# Patient Record
Sex: Female | Born: 1980
Health system: Southern US, Community
[De-identification: ages and names within clinical notes are randomized; demographics above are authoritative.]

## PROBLEM LIST (undated history)

## (undated) DIAGNOSIS — Z8719 Personal history of other diseases of the digestive system: Secondary | ICD-10-CM

## (undated) DIAGNOSIS — Z8742 Personal history of other diseases of the female genital tract: Secondary | ICD-10-CM

## (undated) DIAGNOSIS — N390 Urinary tract infection, site not specified: Secondary | ICD-10-CM

## (undated) DIAGNOSIS — B019 Varicella without complication: Secondary | ICD-10-CM

## (undated) DIAGNOSIS — N971 Female infertility of tubal origin: Secondary | ICD-10-CM

## (undated) DIAGNOSIS — K219 Gastro-esophageal reflux disease without esophagitis: Secondary | ICD-10-CM

## (undated) HISTORY — DX: Varicella without complication: B01.9

## (undated) HISTORY — DX: Urinary tract infection, site not specified: N39.0

## (undated) HISTORY — DX: Gastro-esophageal reflux disease without esophagitis: K21.9

## (undated) HISTORY — DX: Female infertility of tubal origin: N97.1

## (undated) HISTORY — DX: Personal history of other diseases of the female genital tract: Z87.42

## (undated) HISTORY — DX: Personal history of other diseases of the digestive system: Z87.19

---
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
## 2002-06-14 ENCOUNTER — Emergency Department (HOSPITAL_COMMUNITY): Admission: EM | Admit: 2002-06-14 | Discharge: 2002-06-14 | Payer: Self-pay

## 2002-07-16 ENCOUNTER — Ambulatory Visit (HOSPITAL_COMMUNITY): Admission: RE | Admit: 2002-07-16 | Discharge: 2002-07-16 | Payer: Self-pay

## 2003-02-05 ENCOUNTER — Observation Stay (HOSPITAL_COMMUNITY): Admission: AD | Admit: 2003-02-05 | Discharge: 2003-02-06 | Payer: Self-pay | Admitting: Family Medicine

## 2003-04-09 ENCOUNTER — Other Ambulatory Visit: Admission: RE | Admit: 2003-04-09 | Discharge: 2003-04-09 | Payer: Self-pay | Admitting: Obstetrics and Gynecology

## 2003-06-04 ENCOUNTER — Inpatient Hospital Stay (HOSPITAL_COMMUNITY): Admission: EM | Admit: 2003-06-04 | Discharge: 2003-06-06 | Payer: Self-pay | Admitting: Emergency Medicine

## 2003-08-13 ENCOUNTER — Inpatient Hospital Stay (HOSPITAL_COMMUNITY): Admission: EM | Admit: 2003-08-13 | Discharge: 2003-08-15 | Payer: Self-pay | Admitting: Emergency Medicine

## 2004-06-20 ENCOUNTER — Other Ambulatory Visit: Admission: RE | Admit: 2004-06-20 | Discharge: 2004-06-20 | Payer: Self-pay | Admitting: Obstetrics and Gynecology

## 2004-07-14 ENCOUNTER — Emergency Department (HOSPITAL_COMMUNITY): Admission: EM | Admit: 2004-07-14 | Discharge: 2004-07-15 | Payer: Self-pay | Admitting: Emergency Medicine

## 2005-07-20 ENCOUNTER — Other Ambulatory Visit: Admission: RE | Admit: 2005-07-20 | Discharge: 2005-07-20 | Payer: Self-pay | Admitting: Obstetrics and Gynecology

## 2007-02-03 ENCOUNTER — Other Ambulatory Visit: Admission: RE | Admit: 2007-02-03 | Discharge: 2007-02-03 | Payer: Self-pay | Admitting: Obstetrics and Gynecology

## 2007-02-04 ENCOUNTER — Emergency Department (HOSPITAL_COMMUNITY): Admission: EM | Admit: 2007-02-04 | Discharge: 2007-02-04 | Payer: Self-pay | Admitting: Emergency Medicine

## 2007-05-22 ENCOUNTER — Emergency Department (HOSPITAL_COMMUNITY): Admission: EM | Admit: 2007-05-22 | Discharge: 2007-05-23 | Payer: Self-pay | Admitting: Emergency Medicine

## 2007-05-30 ENCOUNTER — Emergency Department (HOSPITAL_COMMUNITY): Admission: EM | Admit: 2007-05-30 | Discharge: 2007-05-30 | Payer: Self-pay | Admitting: Family Medicine

## 2008-06-26 LAB — HM PAP SMEAR: HM Pap smear: NORMAL

## 2008-07-05 ENCOUNTER — Ambulatory Visit (HOSPITAL_COMMUNITY): Admission: RE | Admit: 2008-07-05 | Discharge: 2008-07-05 | Payer: Self-pay | Admitting: Obstetrics and Gynecology

## 2010-11-03 NOTE — Consult Note (Signed)
NAME:  Julie Daniel, Julie Daniel                         ACCOUNT NO.:  [id number]   MEDICAL RECORD NO.:  [id number]                   PATIENT TYPE:  INP   LOCATION:  5705                                 FACILITY:  [facility]   PHYSICIAN:  Charles A. Sydnee Cabal, MD            DATE OF BIRTH:  1980/09/24   DATE OF CONSULTATION:  06/04/2003  DATE OF DISCHARGE:                                   CONSULTATION   REASON FOR CONSULTATION:  Abdominal-pelvic pain.   HISTORY OF PRESENT ILLNESS:  She is a 30 year old para 0, 0, 0, 0, last  menstrual period about three weeks ago who presented complaining of a one-  week history of increasing abdominal-pelvic pain.  She was seen in my office  10 days ago and felt to have a ruptured ovarian cyst, but was recuperating  well on pain medication, from the ultrasound that was done about four days  prior.  This was felt to be a left ovarian cyst.  She had left-sided pain  initially and then this shifted over to the right.  With the ultrasound she  had some right pelvic pain although ultrasound showed a cyst on the left  ovary.  A small cyst, minimally enlarged ovary of 5 cm with multiple  follicles.  There was free fluid in the pelvis.  She denies history of PID.  She has some irregularity in her periods, is on no contraception, and states  she has not gotten pregnant with intercourse for the last five years.  She  is frustrated in this regard.  Specifically for this hospitalization she  stated she had nausea and vomiting and no bowel movement for 10 days.  She  used Mag-Citrate two days ago and did get liquid expelled.  She is hesitant  to state she has flatus but states there is no bowel movement for 10 days.  Usual bowel movement picture would be once every three to four days.  She  complains of pain in the right subcostal region, right flank region and down  into the right lower quadrant.  She states this pain has been constant with  exacerbations that are sharp and  shooting and stabbing up into the ribs.  Nausea and vomiting has been present but then she volunteers further  information that nausea and vomiting is a daily expectation and experience  with her for the last several years.  She does feel she keeps some things  down, she does not have projectile vomitus or vomiting all material such as  liquids.  She was able to take a laxative two days ago without difficulty.  She last ate two days ago.   PAST MEDICAL HISTORY:  Chlamydia, treated within the last year.   SURGICAL HISTORY:  None.   MEDICATIONS:  Hydrocodone since diagnosis of ruptured ovarian cyst  approximately 10 days ago.   ALLERGIES:  No known drug allergies.   SOCIAL HISTORY:  Smokes half-pack per  day of cigarettes.  No alcohol or drug  use. SCD exposure with HPV and chlamydia in the past.  She states she is  married and has a monogamous relationship with her husband.   FAMILY HISTORY:  Denies family history of breast, cervical, colon cancer,  lymphoma, coronary artery disease, stroke, diabetes, or hypertension.   REVIEW OF SYSTEMS:  Denies fever or chills, skin lesions or rashes,  headaches, dizziness, seasonal allergies, chest pain, shortness of breath,  wheezing.  She denies diarrhea.  She does have constipation, no bleeding per  rectum, melena or hematochezia.  She has infrequent urination, no dysuria,  urgency or frequency.  She is irritated regarding ongoing problems without  specific answer as far as to ovarian cyst and infertility, and frustrated  that she cannot go smoke during this hospitalization, feeling that smoking  would help her with a bowel movement.   VITAL SIGNS:  See vitals as noted in the chart and on the chart vitals have  been stable  Not available to me at this dictation other than vitals from last night.  Temperature 98.1, respirations 20, pulse 68, blood pressure 107/54.   LABORATORY DATA:  Hematocrit and hemoglobin 13.3, 39.  BMET 138, sodium  3.6,  potassium 107, chloride 28, BUN 3, creatinine 0.5, glucose 86, calcium 8.0.   PHYSICAL EXAMINATION:  GENERAL:  Alert and oriented x3, no distress at this  time.  HEENT:  Normal grossly.  CARDIOVASCULAR:  Regular rate and rhythm.  LUNGS:  Clear bilaterally.  BREASTS:  Exam deferred.  ABDOMEN:  Upper quadrant soft and nontender.  Right subcostal region mildly  tender with examination to deep palpation, costal edge not enlarged, spleen  not palpable.  Left lower quadrant nontender and soft, overall lower  quadrants bilaterally soft with no guarding or rebound or peritoneal signs  present.  In the mid epigastrium there is some mild fullness with  possibility of stool in the colon, this is mobile and not firm.  PELVIC EXAM:  Bimanual was undertaken without discharge or bleeding.  There  is no cervical motion tenderness present.  Uterus is not enlarged, mid  plane.  Left ovary palpable within normal size grossly and nontender. Right  ovary specifically palpated within normal size and nontender.  Uterus was  nontender.  Examination did not reproduce abdominal-pelvic pain findings.  Additionally, bowel sounds are present and I did not hear any tympanic bowel  sounds or rushes.  Abdomen was non-distended.   Cat scan showed a possible partial small bowel obstruction process.  Transvaginal ultrasound showed a left ovarian cyst with some peritoneal free  fluid, this is consistent with examination at my office in the past two  weeks as far as the ovary and cyst and free fluid.   ASSESSMENT:  1. Abdominal pain, unclear etiology.  I did not find clinical findings     consistent with bowel obstruction at this time.  I did not find an acute     abdomen process at this time.  I do not feel that gynecologically there     is any process directly contributing to the etiology of her abdominal     discomfort at this time.  Exam is improved overall based on my    examination even at my office about  10 days ago.   PLAN:  This patient will likely need to have laparoscopy done as an  outpatient.  If further nausea and vomiting and pain were the main issues,  perhaps general surgery  consultation would be in order, if she continues  with nondescript pain could proceed with laparoscopy for evaluation from a  gynecological stand-point although I do not feel gynecologically her pain  during this admission is centered in the pelvis specifically, related to a  gynecological problem.  I do feel a pregnancy test should be done once  again.  Consideration could be given for pain up in the subcostal region to  evaluate the gallbladder, such as gallbladder evaluation with an ultrasound  would be in order.  If this is not done during this admission I would  recommend this be arranged on an outpatient basis.  I will go ahead and give  her a prescription for pain, Darvocet N-100 for the patient to use.  She  does have some Hydrocodone at home to use if the Darvocet does not cover the  pain and will see the patient in follow up at the office.  Should Dr.  Tresa Endo agree and discharge the patient, the patient is in agreement.                                               Charles A. Sydnee Cabal, MD    CAD/MEDQ  D:  06/04/2003  T:  06/05/2003  Job:  [id number]

## 2010-11-03 NOTE — H&P (Signed)
NAMEJAZARA, Julie Daniel                         ACCOUNT NO.:  [id number]   MEDICAL RECORD NO.:  [id number]                   PATIENT TYPE:  INP   LOCATION:  5705                                 FACILITY:  [facility]   PHYSICIAN:  Jackie Plum, M.D.             DATE OF BIRTH:  [birth year]   DATE OF ADMISSION:  06/04/2003  DATE OF DISCHARGE:                                HISTORY & PHYSICAL   PROBLEM LIST:  1. Partial small-bowel obstruction secondary to pelvic process.  2. History of ovarian cyst.   CHIEF COMPLAINT:  Abdominal pain x1 week.   HISTORY OF PRESENT ILLNESS:  The patient is a 30 year old Caucasian lady who  presents with a one-week history of abdominal pain which she describes as  being constant and has been increasing in intensity.  The site has been  described as more of generalized and diffuse abdominal pain especially  involving the right lumbar and right lower quadrant area.  She has been  having some vomiting every day, in the morning, and has been constipated  without any bowel movement for one week.  The severity of the pain was  described as moderate in intensity, occasionally aggravated by movements and  deep breaths, without any known relieving factor.  She denies any frequency  of micturition or dysuria, fever or chills, headaches, sore throat,  discharge in his cough, sputum production, shortness of breath or chest  pain.  She admits to back pain, especially involving the right flank area.  Her last menstrual period was about three weeks ago.  The patient was  subsequently seen by Dr. Sydnee Cabal of [facility] for evaluation of  suspected ovarian cyst rupture.  On account of progressively worsening  abdominal pain, the patient was sent from her primary care physician, Dr.  Aurelio Brash office, for evaluation at the ED.  ED exam was notable for  moderate tenderness involving the epigastric area with increased bowel  sounds.  Pelvic exam was done by the EDP,  which is notable for right adnexal  tenderness and suspected right adnexal mass.  Initial acute abdominal series  was done which was negative for any acute process.  This was followed up  with a CT of the abdomen and pelvis which revealed a partial small-bowel  obstruction with 2 cm cyst involving the left adnexa and complex fluid in  the pelvis.  Followup ultrasound revealed a left ovarian cyst with moderate  free fluid in the pelvis.  She was given an IV of normal saline at 150 mL  per hour with 4 mg of IV morphine and subsequently, Dilaudid 1 mg IV  followed with GI cocktail and we were called to evaluate for admission.   PAST MEDICAL HISTORY:  Please see problem list above.   MEDICATION HISTORY:  The patient is not on any regular medications.   ALLERGY HISTORY:  There are no known drug allergies.   FAMILY HISTORY:  Family  history is negative for heart disease or diabetes  mellitus or hypertension.   SOCIAL HISTORY:  She works in [employer].  She lives with her boyfriend  and does not have any kids.  She does not smoke cigarettes nor drink  alcohol.   REVIEW OF SYSTEMS:  Significant positive and negative are as in the HPI  above, otherwise, unremarkable.   PHYSICAL EXAMINATION:  VITAL SIGNS:  Blood pressure was 121/66, temperature  98.6, pulse 66, respiratory rate 16, saturations 100% on room air.  GENERAL:  She was in moderate painful distress.  HEENT:  Normocephalic, atraumatic.  Pupils were equal, round and reactive to  light.  Extraocular movements were intact.  Oropharynx was slightly dry  without any oropharyngeal exudate or erythema.  NECK:  Neck was supple with no thyromegaly.  LUNGS:  Lungs were clear to auscultation bilaterally.  ABDOMEN:  Exam was notable for generalized guarding, especially in the  region of the right upper quadrant, right lumbar area, as well as right  lower quadrant region.  There was some significant guarding without any  rebound  tenderness.  Bowel sounds were increased.  There was no  hepatosplenomegaly.  PELVIC:  Exam was not repeated.  RECTAL:  Exam notable for heme-negative stools, according to the EDP.  SKIN:  Skin was warm and dry.  EXTREMITIES:  No pedal edema, no clubbing.  NEUROLOGIC:  Alert and oriented x3.  No focal deficits.   LABORATORIES:  Abdominal x-ray, abdominal and pelvic CT and pelvic  ultrasound as mentioned under HPI above.   WBC count 7.6, hemoglobin 13.3, hematocrit 39.0, MCV 95.1, platelet count  308,000.  Sodium 139, potassium 3.5, chloride 106, glucose 91, BUN 4,  creatinine 0.6, total bilirubin less than 0.1, alkaline phosphatase 63,  OT  17, PT 12, total protein 6.4, albumin 3.0, lipase 24.  Urine pregnancy test  was negative.  UA was negative for any urinary tract infection.  GC/yeast --  none seen, Trichomonas -- none seen, a few clue cells, no wbc's on wet prep.   ASSESSMENT:  Partial small-bowel obstruction secondary to pelvic process.  Differential for pelvic process includes pelvic abscess such as pelvic  hematoma from possible ruptured ovarian cyst.  The patient relates that she  had seen her gynecologist recently who had thought she had a pelvic cyst  rupture.   PLAN:  The plan of care is to admit the patient to a non-telemetry bed,  start her on supportive care including IV fluid supplementation and  analgesics.  She will be admitted per [place] for now.  Her obstruction will be  monitored and we will try to contact her gynecologist for further evaluation  regarding her pelvic fluid.  The question is whether she will need  culdocentesis to further evaluate this so-called pelvic fluid which may be  etiologic of whatever the cause is in her partial small-bowel obstruction.                                                Jackie Plum, M.D.    GO/MEDQ  D:  06/04/2003  T:  06/04/2003  Job:  [id number]   cc:   Vikki Ports, M.D.  [address]   [place] [address]  Fax: [phone]   Charles A. Sydnee Cabal, MD  Fax: [phone]

## 2010-11-03 NOTE — Discharge Summary (Signed)
NAME:  Julie Daniel, Julie Daniel                         ACCOUNT NO.:  [id number]   MEDICAL RECORD NO.:  [id number]                   PATIENT TYPE:  INP   LOCATION:  5705                                 FACILITY:  [facility]   PHYSICIAN:  Sherin Quarry, MD                   DATE OF BIRTH:  08/14/1980   DATE OF ADMISSION:  06/03/2003  DATE OF DISCHARGE:  06/06/2003                                 DISCHARGE SUMMARY   Julie Daniel is a 30 year old lady who initially presented to the [facility] Emergency Room on June 04, 2003, with a history of abdominal pain  which she described as being constant and increasing in intensity.  If the  exact site of this discomfort was somewhat uncertain, she initially  described it as being in the right lower quadrant area.  Subsequently she  indicated it was more in the right lumbar area and then later she indicated  it was more in the right upper quadrant area.  The patient reported that she  had been experiencing vomiting on a daily basis every morning for several  years.  She described the pain as being of moderate to severe intensity.  She said that there was no associated dysuria, fevers or chills.  She had no  cough.  There was no hematemesis or melena.  Her last menstrual period was  three weeks ago.  In the emergency room, prior to our evaluation, the  patient underwent a CT scan of he abdomen.  This showed complex fluid within  the pelvis.  The right ovary was noted to be enlarged with multiple cystic  areas.  The left ovary was similarly sized.  There was a 3.4 cm cyst in the  left adnexa, a moderate amount of fluid was noted in the pelvis.  It was  suggested that this might be a hematoma secondary to a ruptured ovarian  cyst.  No other abnormalities were seen on the CT scan of the abdomen.  Pregnancy test was negative.  Liver profile was within normal limits.  Urinalysis was normal.  White count was 7600, hemoglobin 11.7, differential  was normal.   Liver profile was notable for albumin of 3.0.  Subsequent to  admission, the patient was once again seen by Dr. Sydnee Cabal.  He repeated a  pelvic examination which he described as benign.  He suggested that a  gallbladder ultrasound be obtained.  The verbal report on the gallbladder  ultrasound was that it was normal.  Dr. Sydnee Cabal recommended that the  patient return to his office in one to two weeks and that he would proceed  with an outpatient laparoscopy if the pain persisted.  the patient was also  seen in consultation by Dr. Johna Sheriff of the general surgery department.  Dr.  Johna Sheriff examined the patient and reviewed her x-rays.  It was his  conclusion that the patient had a ruptured ovarian cyst.  He felt that there  was not a primary bowel process.  He felt the patient did not have small-  bowel obstruction.  He recommended that laxatives be administered.  On  June 06, 2003, the patient was discharged.   DISCHARGE DIAGNOSES:  1. Abdominal pain possibly secondary to ruptured ovarian cyst.  2. Constipation.   DISCHARGE MEDICATIONS:  Dr. Sydnee Cabal wrote a prescription for Darvocet for  the patient with instructions to take one to two every four hours p.r.n. for  pain.  I also wrote her a prescription for MiraLax to take one dose b.i.d.  p.r.n. for constipation and Lactulose one to two tablespoons p.r.n. for  constipation.   The patient will follow up with Dr. Sydnee Cabal in seven days to determine if  laparoscopy is indicated.   CONDITION ON DISCHARGE:  Fair.                                                Sherin Quarry, MD    SY/MEDQ  D:  06/06/2003  T:  06/07/2003  Job:  [id number]   cc:   Vikki Ports, M.D.  [address]  [place]  [place] [address]  Fax: [phone]   Charles A. Sydnee Cabal, MD  Fax: [phone]   Lorne Skeens. Hoxworth, M.D.  [address]  [place]  [place] [address]  Fax: [phone]

## 2010-11-03 NOTE — Procedures (Signed)
[place]  Patient:    Julie Daniel, Julie Daniel Visit Number: [id number] MRN: [id number]          Service Type: CAT Location: [facility] 2860 01 Attending Physician:  Armanda Magic Dictated by:   Armanda Magic, M.D. Admit Date:  08/26/2001 Discharge Date: 08/26/2001                             Procedure Report  INCOMPLETE  DATE OF BIRTH:  August 22, 1979.  REFERRING PHYSICIAN:  Meade Maw, M.D.  CHIEF COMPLAINT:  This is a 30 year old white female with multiple episodes of Dictated by:   Armanda Magic, M.D. Attending Physician:  Armanda Magic DD:  08/27/01 TD:  08/27/01 Job: [id number]

## 2010-11-03 NOTE — Discharge Summary (Signed)
NAME:  PAELYN, SMICK                         ACCOUNT NO.:  [id number]   MEDICAL RECORD NO.:  [id number]                   PATIENT TYPE:  INP   LOCATION:  5705                                 FACILITY:  [facility]   PHYSICIAN:  Sherin Quarry, MD                   DATE OF BIRTH:  25-Feb-1981   DATE OF ADMISSION:  06/03/2003  DATE OF DISCHARGE:  06/06/2003                                 DISCHARGE SUMMARY   Julie Daniel is a 30 year old lady was initially admitted by Dr. Julio Sicks on December 17 after she presented to the [facility] with a 1  week history of abdominal pain described as being constant and increasing in  intensity.  He indicated that she described the pain as being in the right  lumbar and right lower quadrant area.  She stated that she had been vomiting  every day and had been constipated for about a week. She described the pain  as moderate-to-severe and stated that it was not associated with any  dysuria, fever, headache, sore throat, unusual vaginal discharge, coughing.  Chest pain, or shortness of breath.  Her last menstrual period was 3 weeks  prior to admission.   On presentation to the emergency room a pelvic exam was performed which was  remarkable for right adnexal tenderness.  An adnexal mass was suspected and  a CT scan of the abdomen and pelvis were performed which showed a possible,  partial small-bowel obstruction; as well as a fluid collection in the pelvis  that was interpreted as being likely secondary to a ruptured ovarian cyst.  An ultrasound was done which also showed a moderate amount of free fluid in  the pelvis.  The patient was started on an IV of normal saline and was given  4 mg of IV morphine for pain.   PHYSICAL EXAMINATION:  VITAL SIGNS:  Dr. Julio Sicks reports that on her  physical exam blood pressure was121/66;  temperature was 98.6; pulse was 66.  HEENT:  Within normal limits.  CHEST:  The chest was clear to auscultation and  percussion.  BACK:  Examination of the back revealed no CVA or point tenderness.  CARDIOVASCULAR:  Exam revealed normal S1 and S2 without rubs, murmurs, or  gallops.  ABDOMEN:  The abdomen was remarkable for diffuse complaints of abdominal  pain especially in the right upper quadrant and right lumbar area.  There  was no guarding or rebound.  Bowel sounds were somewhat hyperactive.  PELVIC:  Pelvic exam was not repeated.  NEUROLOGIC:  Examination of the extremities was normal.   The white count was 7600, hemoglobin was 13.3, hematocrit 39.  Sodium 139,  potassium 3.5, creatinine 0.6, BUN was 4.  Liver profile was normal.  Urine  pregnancy was negative.  Urinalysis was normal.  A GC probe was negative.  Wet prep was negative. Urine culture was negative.  RPR was  nonreactive.   Consultation was obtained with Dr. Sydnee Cabal of the gynecology department.  He examined the patient very thoroughly and felt that there was no serious  gynecologic process occurring at that time. He felt that her ovaries were  normal; the uterus was not enlarged; and there was no cervical motion  tenderness.  He suggested that laparoscopy be done as an outpatient if the  patient's abdominal pain did not resolve.  He also indicated that he  recommended a general surgical consultation.   Consultation was obtained with Dr. Johna Sheriff of the general surgery  department.  He felt that the pain that the patient was experiencing was  secondary to a ruptured ovarian cyst.  He did not think that there was any  primary bowel process was occurring. He also indicated that the patient  would need to have a laparoscopy at some point in the future.   On December 19, I advised the patient that the plan was going to be to  discharge her with follow up with Dr. Sydnee Cabal in 7 days for a re-  examination and a determination of whether a laparoscopy would be performed.  Therefore, on December 19 the patient was discharged.    DISCHARGE DIAGNOSIS:  Pelvic pain possibly secondary to ruptured ovarian  cyst.   DISCHARGE MEDICATIONS:  1. Darvocet p.r.n. for pain.  2. MiraLax p.r.n. for constipation.  3. Lactulose p.r.n. for constipation.   As mentioned, an appointment was made with Dr. Sydnee Cabal in 7 days.                                                Sherin Quarry, MD    SY/MEDQ  D:  06/24/2003  T:  06/25/2003  Job:  [id number]   cc:   Vikki Ports, M.D.  [address]  [place]  [place] [address]  Fax: [phone]   Charles A. Sydnee Cabal, MD  Fax: [phone]   Lorne Skeens. Hoxworth, M.D.  [address]  [place]  [place] [address]  Fax: [phone]

## 2010-11-03 NOTE — Consult Note (Signed)
NAME:  Julie Daniel, BOEREMA                         ACCOUNT NO.:  [id number]   MEDICAL RECORD NO.:  [id number]                   PATIENT TYPE:  INP   LOCATION:  5705                                 FACILITY:  [facility]   PHYSICIAN:  Sharlet Salina T. Hoxworth, M.D.          DATE OF BIRTH:  12/01/1980   DATE OF CONSULTATION:  06/05/2003  DATE OF DISCHARGE:  06/06/2003                                   CONSULTATION   CHIEF COMPLAINT:  Abdominal pain.   HISTORY OF PRESENT ILLNESS:  I was asked by Dr. Tresa Endo to evaluate Ms.  Daniel.  She is a 30 year old white female who was admitted to the hospital  service on June 04, 2003 with persistent abdominal pain.  She gives a  history of onset of right lower quadrant abdominal pain approximately two  weeks prior to this admission.  She describes aching pain in the right lower  quadrant radiating up toward the right mid abdomen and occasionally right  upper quadrant. This has been unchanging throughout the day.  It is  unrelated to eating.  She has had some nausea but rare vomiting during the  course of the pain.  She states that she has had problems with nausea and  frequent morning vomiting for several years.  She states in the years past  endoscopy had been recommended but not performed.  This nausea is really  unchanged during this illness.  She has been having some slightly irregular  menstrual periods.  Her last normal period was on April 25, 2003 and then  she had some slight spotting around May 13, 2003.  The patient was  admitted due to persistence of the pain.  She is not having any fever or  chills.  No particularly aggravating or alleviating factors.  No urinary  burning, frequency or pain.   PAST MEDICAL HISTORY:  Really unremarkable.  She denies illness, surgery or  hospitalizations.  She is on no regular medications.   ALLERGIES:  No allergies.   SOCIAL HISTORY:  She is single, lives with her boyfriend.  Does not smoke  cigarettes or drink alcohol.   FAMILY HISTORY:  Generally unremarkable.   REVIEW OF SYSTEMS:  Positive GI and GU as above, otherwise negative.   PHYSICAL EXAMINATION:  VITAL SIGNS:  Temperature is 98.7, blood pressure  110/60, pulse 80, respirations 16, she has been afebrile throughout her  hospital course.  GENERAL:  She is a well-developed white female in no acute distress.  SKIN:  Warm and dry.  LUNGS:  Clear.  ABDOMEN:  Positive but somewhat hypoactive bowel sounds.  Possible mild  distention but this is minimal.  There is moderate right lower quadrant  tenderness and milder right mid abdominal tenderness.  There is some  guarding.  No evidence of peritonitis.  No rebound or percussion tenderness.  No palpable masses or hepatosplenomegaly.  PELVIC EXAM:  Not repeated.   LABORATORY DATA:  Urine and quantitative  pregnancy test negative.  Liver  function tests normal. Urinalysis negative.  White count 7600, hemoglobin  11.7.  I reviewed her CT and ultrasound personally.  CT scan shows bilateral  ovarian cysts, approximately four, and 3.5 cm on the right and left  respectively.  There is a significant amount of fluid in the pelvis.  There  are a couple of mildly dilated loops of small bowel adjacent to the fluid in  the pelvis with the remainder of the bowel appearing normal.  No bowel wall  thickening.  Transvaginal ultrasound showed ovarian cysts and free fluid  essentially as described by the CT. Abdomen ultrasound was negative.  Normal  gallbladder.  Of note, there is nothing on her CT scan to suggest  appendicitis.   ASSESSMENT AND PLAN:  A persistent right lower quadrant, right mid abdominal  pain.  I suspect that this is secondary to ruptured ovarian cyst with blood  in the pelvis and right gutter and some mild ileus associated with  hemoperitoneum.  I doubt any primary bowel process.  Really no evidence of a  significant bowel obstruction.  No evidence of appendicitis.  The  patient  has been given laxative which seems reasonable.  She has been seen by her  GYN physician who recommends laparoscopy if the pain does not resolve over  the next week or so.  I would certainly agree with this approach.  If  laparoscopy indicates any primary bowel abnormalities or if her pain becomes  more chronic despite treatment for ovarian cysts I might consider GI  evaluation at that time although again I doubt primary GI problems.  She  does have a much longer history of morning nausea and occasional vomiting of  uncertain etiology and at some point esophagogastroduodenoscopy may be  helpful to evaluate this.  If GI or bowel abnormality remains an issue after  the above work up, might consider a small bowel follow through and/or  colonoscopy.                                               Lorne Skeens. Hoxworth, M.D.    Tory Emerald  D:  06/05/2003  T:  06/07/2003  Job:  [id number]

## 2010-11-03 NOTE — Procedures (Signed)
[facility]  Patient:    Julie Daniel, Julie Daniel Visit Number: [id number] MRN: [id number]          Service Type: CAT Location: [facility] 2860 01 Attending Physician:  Armanda Magic Dictated by:   Armanda Magic, M.D. Proc. Date: 08/27/01 Admit Date:  08/26/2001 Discharge Date: 08/26/2001                             Procedure Report  DATE OF BIRTH:  August 22, 1979  REFERRING PHYSICIAN:  Meade Maw, M.D.  PROCEDURE PERFORMED:  Tilt table test.  OPERATOR:  Armanda Magic, M.D.  INDICATIONS:  This is a 30 year old white female with multiple episodes of syncope which began as feeling warmth, diaphoretic and then she passes out. The patient now presents for a tilt table test.  DESCRIPTION OF PROCEDURE:  The patient was brought to the cardiac catheterization laboratory in a fasting, nonsedated state. Informed consent was obtained. The patient was connected to continuous heart rate and pulse oximetry monitoring and intermittent blood pressure monitoring. Blood pressure was measured in the supine position for a total of 6 minutes. Baseline blood pressure was 112/72 to 123/77 with heart rates of 60-70. The patient was then tilted upright to 70 degrees for a total of 30 minutes. The lowest blood pressure during upright tilt was 109/72 with heart rates anywhere from 79 to 123 beats per minute. The patient did note some feelings of her hands getting sweaty, blood pressure at that time was 133/64 with a heart rate of 128. The patient was then placed supine, Isuprel was started at 1 mcg/min to achieve a increase in heart rate of 20% of the baseline. She was then tilted upright again for 10 minutes. Blood pressures ranged from 117/63 to 143/70 mmHg. Heart rates ranged anywhere from 100 beats per minute to 146 beats per minute. The patient did feel some hot flashes but there was no syncope.  IMPRESSION:  Negative tilt table test for syncope.  PLAN:  Refer back to Dr.  Fraser Din. Dictated by:   Armanda Magic, M.D. Attending Physician:  Armanda Magic DD:  08/27/01 TD:  08/27/01 Job: [id number]

## 2010-11-03 NOTE — Discharge Summary (Signed)
[name], Daniel                         ACCOUNT NO.:  [id number]   MEDICAL RECORD NO.:  [id number]                   PATIENT TYPE:  INP   LOCATION:  0442                                 FACILITY:  [facility]   PHYSICIAN:  Sherin Quarry, MD                   DATE OF BIRTH:  March 14, 1981   DATE OF ADMISSION:  08/13/2003  DATE OF DISCHARGE:  08/15/2003                                 DISCHARGE SUMMARY   HISTORY OF PRESENT ILLNESS:  Julie Daniel is a very pleasant 30 year old  lady who works at the [employer].  She reports that prior to her  presentation she had had a sore throat for about seven days.  She was  treated with Mobic for this with minimal benefit.  About three days prior to  admission, she noted the onset of intense swelling and discomfort in the  left side of her neck with a large swollen mass under her jaw.  She  presented to [facility] and was sent for a CT scan of  the neck which was done at [facility].  This showed significant  adenopathy and a left-sided deep 2 cm abscess felt to be in a cervical  node.  She denied any associated fever, cough, shortness of breath, chest  pain, nausea, vomiting, or abdominal pain.  Her last menstrual period was  August 04, 2003.  In light of this finding, the patient was admitted for  treatment of what was felt to be a cervical lymphadenitis.   PHYSICAL EXAMINATION AT THE TIME OF ADMISSION:  HEENT:  Within normal limits  with the exception of the examination of the neck which showed a very  dramatic swelling of the left posterior cervical lymph nodes, especially in  the region just inferior to the mandible.  The left side of the neck was  tender to palpation.  Of note was that the pharyngeal exam was quite normal.  There was no evidence of purulent pharyngitis or peritonsillar abscess.  CHEST:  Clear.  BACK:  No CVA or point tenderness.  CARDIOVASCULAR:  A grade 2/6 systolic murmur best heard at  the left sternal  border.  There were no rubs or gallops.  ABDOMEN:  Benign.  NEUROLOGIC:  Testing was normal.  EXTREMITIES:  Normal.   RELEVANT STUDIES OBTAINED:  A CBC revealed a white count of 12,200.  There  were 67% neutrophils, 26% lymphocytes, and 5% monocytes.  The hemoglobin was  12.9.  The sodium was 136, the potassium was 3.0, and the glucose was 94.  The liver profile was normal.  The chest x-ray revealed a normal sized  heart, the lungs were clear, and no skeletal abnormalities were visualized.   HOSPITAL COURSE:  On admission, the patient was placed on Unasyn 3 g IV  every six hours.  She was given Majik mouthwash p.r.n. for sore throat and  Percocet p.r.n. for  pain.  K-Dur 20 mEq daily was begun.  The patient  remained afebrile.  By August 15, 2003, there had been a dramatic decrease  in the amount of swelling in the left side of the neck.  The mass under the  mandible was much less tender.  It was no longer firm and appeared to be  resolving.   DISPOSITION:  Therefore, on August 15, 2003, the patient was discharged.   DISCHARGE DIAGNOSES:  1. Presumed cervical lymphadenitis.  2. History of ovarian cyst.  3. 10-pack-year smoking history.   DISCHARGE MEDICATIONS:  1. Augmentin 875 mg p.o. b.i.d. with food.  2. Tylox p.r.n. for pain.   FOLLOWUP:  The patient was advised to follow up with Dr. Hyacinth Meeker at [facility] in approximately five days.   The importance of taking the antibiotic until it was completely gone was  emphasized as was the importance of discontinuing cigarette smoking.  I told  her I thought it would be reasonable to return to work on Wednesday.                                               Sherin Quarry, MD    SY/MEDQ  D:  08/15/2003  T:  08/15/2003  Job:  [id number]   cc:   Sigmund Hazel, M.D.  [address]  [address] [place], [place] [address]  Fax: [phone]

## 2011-01-25 ENCOUNTER — Ambulatory Visit (INDEPENDENT_AMBULATORY_CARE_PROVIDER_SITE_OTHER): Payer: BC Managed Care – PPO | Admitting: Family Medicine

## 2011-01-25 ENCOUNTER — Encounter: Payer: Self-pay | Admitting: Family Medicine

## 2011-01-25 VITALS — BP 100/72 | HR 72 | Temp 98.2°F | Resp 12 | Ht 62.5 in | Wt 146.0 lb

## 2011-01-25 DIAGNOSIS — Z720 Tobacco use: Secondary | ICD-10-CM

## 2011-01-25 DIAGNOSIS — F17200 Nicotine dependence, unspecified, uncomplicated: Secondary | ICD-10-CM

## 2011-01-25 DIAGNOSIS — Z0000 Encounter for general adult medical examination without abnormal findings: Secondary | ICD-10-CM

## 2011-01-25 LAB — CBC WITH DIFFERENTIAL/PLATELET
Basophils Absolute: 0 10*3/uL (ref 0.0–0.1)
Basophils Relative: 0.3 % (ref 0.0–3.0)
Eosinophils Absolute: 0.1 10*3/uL (ref 0.0–0.7)
Eosinophils Relative: 1.5 % (ref 0.0–5.0)
HCT: 39.2 % (ref 36.0–46.0)
Hemoglobin: 13.1 g/dL (ref 12.0–15.0)
Lymphocytes Relative: 22.7 % (ref 12.0–46.0)
Lymphs Abs: 1.8 10*3/uL (ref 0.7–4.0)
MCHC: 33.4 g/dL (ref 30.0–36.0)
MCV: 98.6 fl (ref 78.0–100.0)
Monocytes Absolute: 0.3 10*3/uL (ref 0.1–1.0)
Monocytes Relative: 3.1 % (ref 3.0–12.0)
Neutro Abs: 5.9 10*3/uL (ref 1.4–7.7)
Neutrophils Relative %: 72.4 % (ref 43.0–77.0)
Platelets: 238 10*3/uL (ref 150.0–400.0)
RBC: 3.97 Mil/uL (ref 3.87–5.11)
RDW: 12.7 % (ref 11.5–14.6)
WBC: 8.1 10*3/uL (ref 4.5–10.5)

## 2011-01-25 LAB — LIPID PANEL
Cholesterol: 97 mg/dL (ref 0–200)
HDL: 32.9 mg/dL — ABNORMAL LOW (ref >39.00)
LDL Cholesterol: 42 mg/dL (ref 0–99)
Total CHOL/HDL Ratio: 3
Triglycerides: 110 mg/dL (ref 0.0–149.0)
VLDL: 22 mg/dL (ref 0.0–40.0)

## 2011-01-25 LAB — BASIC METABOLIC PANEL
BUN: 6 mg/dL (ref 6–23)
CO2: 27 mEq/L (ref 19–32)
Calcium: 8.6 mg/dL (ref 8.4–10.5)
Chloride: 104 mEq/L (ref 96–112)
Creatinine, Ser: 0.7 mg/dL (ref 0.4–1.2)
GFR: 115.03 mL/min (ref >60.00)
Glucose, Bld: 87 mg/dL (ref 70–99)
Potassium: 3.4 mEq/L — ABNORMAL LOW (ref 3.5–5.1)
Sodium: 139 mEq/L (ref 135–145)

## 2011-01-25 LAB — HEPATIC FUNCTION PANEL
ALT: 12 U/L (ref 0–35)
AST: 21 U/L (ref 0–37)
Albumin: 4 g/dL (ref 3.5–5.2)
Alkaline Phosphatase: 64 U/L (ref 39–117)
Bilirubin, Direct: 0.1 mg/dL (ref 0.0–0.3)
Total Bilirubin: 0.6 mg/dL (ref 0.3–1.2)
Total Protein: 7.1 g/dL (ref 6.0–8.3)

## 2011-01-25 LAB — TSH: TSH: 1.23 u[IU]/mL (ref 0.35–5.50)

## 2011-01-25 NOTE — Progress Notes (Signed)
  Subjective:    Patient ID: Julie Daniel, female    DOB: 1981-06-04, 30 y.o.   MRN: [id number]  HPI New patient to establish care. Past medical history reviewed. She's had some mild GERD symptoms.   Takes no medications. No prior surgeries. No known drug allergies. No real chronic problems.  She does smoke about one half pack cigarettes per day. Has some mild interest in quitting. No regular exercise. Increased soda consumption - about 3 per day  Family history significant for maternal grandfather with lung cancer. Brother had sudden death age 20 of uncertain etiology. Mother with hypertension. Grandmother with type 2 diabetes  Patient works as a Curator at [employer]. Smoking history as above. No alcohol use Tetanus 2010. She sees a gynecologist regularly and has had some abnormal Pap smears previously   Review of Systems  Constitutional: Negative for fever, activity change, appetite change and fatigue.  HENT: Negative for hearing loss, ear pain, sore throat and trouble swallowing.   Eyes: Negative for visual disturbance.  Respiratory: Negative for cough and shortness of breath.   Cardiovascular: Negative for chest pain and palpitations.  Gastrointestinal: Negative for abdominal pain, diarrhea, constipation and blood in stool.  Genitourinary: Negative for dysuria and hematuria.  Musculoskeletal: Negative for myalgias, back pain and arthralgias.  Skin: Negative for rash.  Neurological: Negative for dizziness, syncope and headaches.  Hematological: Negative for adenopathy.  Psychiatric/Behavioral: Negative for confusion and dysphoric mood.       Objective:   Physical Exam  Constitutional: She is oriented to person, place, and time. She appears well-developed and well-nourished.  HENT:  Head: Normocephalic and atraumatic.  Eyes: EOM are normal. Pupils are equal, round, and reactive to light.  Neck: Normal range of motion. Neck supple. No thyromegaly present.    Cardiovascular: Normal rate, regular rhythm and normal heart sounds.   No murmur heard. Pulmonary/Chest: Breath sounds normal. No respiratory distress. She has no wheezes. She has no rales.  Abdominal: Soft. Bowel sounds are normal. She exhibits no distension and no mass. There is no tenderness. There is no rebound and no guarding.  Genitourinary:       As per GYN  Musculoskeletal: Normal range of motion. She exhibits no edema.  Lymphadenopathy:    She has no cervical adenopathy.  Neurological: She is alert and oriented to person, place, and time. She displays normal reflexes. No cranial nerve deficit.  Skin: No rash noted.  Psychiatric: She has a normal mood and affect. Her behavior is normal. Judgment and thought content normal.          Assessment & Plan:  Well visit. Obtain screening lab work. Recommend smoking cessation.  Motivation is fair.  Recommend soda reduction and establishing regular exercise. Adequate calcium and vitamin D consumption

## 2011-01-25 NOTE — Patient Instructions (Signed)
Try to quit smoking. Try to reduce soda consumption.

## 2011-01-26 ENCOUNTER — Telehealth: Payer: Self-pay | Admitting: *Deleted

## 2011-01-26 NOTE — Telephone Encounter (Signed)
patient  Is calling to see if her paperwork is complete.  She is aware of her lab results.

## 2011-01-26 NOTE — Telephone Encounter (Signed)
VM left for pt that no the paperwork is not done yet, most likely by early next week.

## 2011-05-03 ENCOUNTER — Ambulatory Visit (INDEPENDENT_AMBULATORY_CARE_PROVIDER_SITE_OTHER): Payer: BC Managed Care – PPO | Admitting: Internal Medicine

## 2011-05-03 ENCOUNTER — Encounter: Payer: Self-pay | Admitting: Internal Medicine

## 2011-05-03 VITALS — BP 130/80 | HR 66 | Temp 98.0°F | Wt 146.0 lb

## 2011-05-03 DIAGNOSIS — Z720 Tobacco use: Secondary | ICD-10-CM

## 2011-05-03 DIAGNOSIS — M549 Dorsalgia, unspecified: Secondary | ICD-10-CM

## 2011-05-03 DIAGNOSIS — Z8742 Personal history of other diseases of the female genital tract: Secondary | ICD-10-CM

## 2011-05-03 DIAGNOSIS — F17200 Nicotine dependence, unspecified, uncomplicated: Secondary | ICD-10-CM

## 2011-05-03 DIAGNOSIS — N971 Female infertility of tubal origin: Secondary | ICD-10-CM

## 2011-05-03 DIAGNOSIS — R300 Dysuria: Secondary | ICD-10-CM

## 2011-05-03 DIAGNOSIS — K219 Gastro-esophageal reflux disease without esophagitis: Secondary | ICD-10-CM

## 2011-05-03 DIAGNOSIS — R109 Unspecified abdominal pain: Secondary | ICD-10-CM

## 2011-05-03 LAB — POCT URINALYSIS DIPSTICK
Bilirubin, UA: NEGATIVE
Blood, UA: NEGATIVE
Glucose, UA: NEGATIVE
Ketones, UA: NEGATIVE
Leukocytes, UA: NEGATIVE
Nitrite, UA: NEGATIVE
Protein, UA: NEGATIVE
Spec Grav, UA: <=1.005
Urobilinogen, UA: 0.2
pH, UA: 6

## 2011-05-03 LAB — POCT URINE PREGNANCY: Preg Test, Ur: NEGATIVE

## 2011-05-03 NOTE — Patient Instructions (Addendum)
DECREASE  caffiene beverages to help with the reflux  Do not eat right before bed. Take OTC omeprazole /prilosec  Before a meal once a day for 2 weeks to see if helps.  Will notify you  of labs when available.   Make fu appt with dr Caryl Never in about 2-3 weeks  For pap and fu if pain perists or is recurrent.  Stopping tobacco is good for your health.  Call tomorrow about labs test before the weekend and how you are doing.  TRy advil  For pain for now if needed.

## 2011-05-03 NOTE — Progress Notes (Signed)
  Subjective:    Patient ID: Julie Daniel, female    DOB: 08/31/1980, 30 y.o.   MRN: [id number]  HPI Patient comes in today for SDA  For acute problem evaluation. Pt of Dr B who is out of office  Onset 6  Days ago with pain in sides  Some back  Pain comes and goes . Less after period ending . Left more than right . This recent period slightly different lighter   Remote hx of cyst of such   On ocps were given  and then had renal  infection at that time.  No recent uti sx hematuria.   Review of Systems No fever.    But has acid reflux    Spits every am. No water . Mountain dew 3 soda,  No coffee tea energy No meds.  Vaginal sx negs  Just came off periods.  ? Less after period  . No NVD no flank pain but some back pain Hx of tubal disease didn't use contracepotion last gyne check a n umber of years ago. Left side  . [facility] 3 months ago   This period   Slightly different  . Ended today.  Uses tampons not quite as heavy.   Past history family history social history reviewed in the electronic medical record.     Objective:   Physical Exam Physical Exam: Vital signs reviewed WUJ:WJXB is a well-developed well-nourished alert cooperative  white female who appears her stated age in no acute distress.  HEENT: normocephalic  traumatic , grossly normal  CHEST/PULM:  Clear to auscultation and percussion breath sounds equal no wheeze , rales or rhonchi. No chest wall deformities or tenderness. CV: PMI is nondisplaced, S1 S2 no gallops, murmurs, rubs. Peripheral pulses are full without delay ABDOMEN: Bowel sounds normal nontender  No guard or rebound, no hepato splenomegal no CVA tenderness.  No hernia.  Points to tenderness at lateral gutters lower no g or r  Extremtities:  No clubbing cyanosis or edema, no acute joint swelling or redness no focal atrophy NEURO:  Oriented x3, cranial nerves 3-12 appear to be intact, no obvious focal weakness,gait within normal limits no abnormal reflexe SKIN: No  acute rashes normal turgor, color, no bruising or petechiae. PSYCH: Oriented, good eye contact, no obvious depression anxiety, cognition and judgment appear normal. Pelvic dferred LN no adenopathy     Assessment & Plan:  abd pain  Unclear cause  Bilateral lower abdomen but not really pelvic .  Hx of  Ovarian cyst and tubal scarring  ? If could have endometriosis or other  Review of record show that she had sbo with her cysts  No evidence of this today  Lower risk infection but check  rine screen and use advil  For now   May need further evaluation if  persistent or progressive ? Gyne check [place] etc.   Tobacco  rec  Dc.  Call or seek care  if  persistent or progressive or alarm symptoms as discussed.

## 2011-05-04 ENCOUNTER — Telehealth: Payer: Self-pay | Admitting: Family Medicine

## 2011-05-04 LAB — GC/CHLAMYDIA PROBE AMP, URINE
Chlamydia, Swab/Urine, PCR: NEGATIVE
GC Probe Amp, Urine: NEGATIVE

## 2011-05-04 NOTE — Telephone Encounter (Signed)
Pt called requesting results of UA

## 2011-05-04 NOTE — Telephone Encounter (Signed)
Pt informed all testing was negative FYI

## 2011-05-08 ENCOUNTER — Encounter: Payer: Self-pay | Admitting: Family Medicine

## 2011-05-08 ENCOUNTER — Other Ambulatory Visit (HOSPITAL_COMMUNITY)
Admission: RE | Admit: 2011-05-08 | Discharge: 2011-05-08 | Disposition: A | Discharge status: Home / Self Care | Payer: BC Managed Care – PPO | Source: Ambulatory Visit | Attending: Family Medicine | Admitting: Family Medicine

## 2011-05-08 ENCOUNTER — Ambulatory Visit (INDEPENDENT_AMBULATORY_CARE_PROVIDER_SITE_OTHER): Payer: BC Managed Care – PPO | Admitting: Family Medicine

## 2011-05-08 DIAGNOSIS — R1030 Lower abdominal pain, unspecified: Secondary | ICD-10-CM

## 2011-05-08 DIAGNOSIS — Z87898 Personal history of other specified conditions: Secondary | ICD-10-CM

## 2011-05-08 DIAGNOSIS — N644 Mastodynia: Secondary | ICD-10-CM

## 2011-05-08 DIAGNOSIS — Z8742 Personal history of other diseases of the female genital tract: Secondary | ICD-10-CM

## 2011-05-08 DIAGNOSIS — R109 Unspecified abdominal pain: Secondary | ICD-10-CM

## 2011-05-08 DIAGNOSIS — Z01419 Encounter for gynecological examination (general) (routine) without abnormal findings: Secondary | ICD-10-CM | POA: Insufficient documentation

## 2011-05-08 NOTE — Patient Instructions (Signed)
Call in one week if breast pain not fully resolved

## 2011-05-08 NOTE — Progress Notes (Signed)
  Subjective:    Patient ID: Julie Daniel, female    DOB: [birth year], 30 y.o.   MRN: [id number]  HPI  Patient seen for followup regarding recent nonspecific bilateral lower abdominal pain. Abdominal pain essentially fully resolved at this time. Refer to recent office note per Dr Fabian Sharp which was reviewed.  Patient requesting Pap smear today. Her recent abdominal pain lasted about one week. No associated fever. No dysuria. No change in bowel habits. Previous history of abnormal Pap smear and what sounds like colposcopy x2 but negative biopsies. Last Pap smear about 2 years ago.  Patient relates recent history of fairly nonspecific poorly localized left breast pain.  Duration about 1-2 weeks.  No nipple discharge. No nipple inversion. No cellulitis changes.  No masses palpated.  Patient continues to smoke.  Low motivation to quit.  Past Medical History  Diagnosis Date  . Chicken pox   . GERD (gastroesophageal reflux disease)   . UTI (urinary tract infection)   . History of ovarian cyst      treated woth OCPS  . Hx SBO   . Infertility of tubal origin     hx of blocked tube    No past surgical history on file.  reports that she has been smoking Cigarettes.  She has a 5 pack-year smoking history. She does not have any smokeless tobacco history on file. Her alcohol and drug histories not on file. family history includes Cancer in her maternal grandfather; Diabetes in her maternal grandmother; Hypertension in her mother; and Sudden death (age of onset:24) in her brother. No Known Allergies    Review of Systems  Constitutional: Negative for fever, chills, appetite change, fatigue and unexpected weight change.  Respiratory: Negative for shortness of breath.   Cardiovascular: Negative for chest pain.  Gastrointestinal: Negative for nausea, vomiting, abdominal pain, diarrhea, constipation, blood in stool and abdominal distention.  Genitourinary: Negative for dysuria.       Objective:   Physical Exam  Constitutional: She appears well-developed and well-nourished. No distress.  Cardiovascular: Normal rate, regular rhythm and normal heart sounds.   Pulmonary/Chest: Effort normal and breath sounds normal. No respiratory distress. She has no wheezes. She has no rales.       Breasts are symmetrical no masses. No nipple inversion. No nipple discharge. No redness or warmth  Genitourinary: Vagina normal and uterus normal. No vaginal discharge found.       Normal external genitalia. Cervix normal in appearance. Pap smear obtained. Bimanual exam no masses. No cervical motion tenderness          Assessment & Plan:  #1 history of abnormal Pap smear. Pap smear repeated. Basically normal exam. #2 left breast pain with normal exam. Call in one week if pain not fully resolved

## 2011-05-14 NOTE — Progress Notes (Signed)
Quick Note:  Left a message for return call. ______ 

## 2011-05-14 NOTE — Progress Notes (Signed)
Quick Note:  Pt aware ______ 

## 2011-05-16 ENCOUNTER — Ambulatory Visit: Payer: BC Managed Care – PPO | Admitting: Family Medicine

## 2011-09-03 ENCOUNTER — Ambulatory Visit (INDEPENDENT_AMBULATORY_CARE_PROVIDER_SITE_OTHER): Payer: BC Managed Care – PPO | Admitting: Family Medicine

## 2011-09-03 ENCOUNTER — Encounter: Payer: Self-pay | Admitting: Family Medicine

## 2011-09-03 VITALS — BP 130/88 | Temp 98.1°F | Wt 150.0 lb

## 2011-09-03 DIAGNOSIS — H109 Unspecified conjunctivitis: Secondary | ICD-10-CM

## 2011-09-03 DIAGNOSIS — H10029 Other mucopurulent conjunctivitis, unspecified eye: Secondary | ICD-10-CM

## 2011-09-03 MED ORDER — POLYMYXIN B-TRIMETHOPRIM 10000-0.1 UNIT/ML-% OP SOLN: 2.0000 [drp] | OPHTHALMIC | Status: AC

## 2011-09-03 NOTE — Progress Notes (Signed)
  Subjective:    Patient ID: Julie Daniel, female    DOB: 1980-08-05, 31 y.o.   MRN: [id number]  HPI  Acute visit.  One-day history of left eye drainage. She woke up this morning with matting of the left eye. No visual impairment. Slight itching. No pain.  Generally wears contacts but left out today.  Denies any eye injury. No right eye symptoms. Patient used a warm washcloth with some relief this morning.  Review of Systems  Constitutional: Negative for fever and chills.  HENT: Negative for congestion.   Eyes: Positive for discharge, redness and itching. Negative for pain and visual disturbance.  Respiratory: Negative for cough.   Hematological: Negative for adenopathy.       Objective:   Physical Exam  Constitutional: She appears well-developed and well-nourished.  HENT:  Right Ear: External ear normal.  Left Ear: External ear normal.  Mouth/Throat: Oropharynx is clear and moist.  Eyes: Pupils are equal, round, and reactive to light.       Injected left eye. Left conjunctiva is erythematous. Minimal matting drainage margin of the lid. Cornea appears normal.  Neck: Neck supple.  Pulmonary/Chest: Effort normal and breath sounds normal. No respiratory distress. She has no wheezes. She has no rales.          Assessment & Plan:  Bacterial conjunctivitis left eye. Polytrim ophthalmic drops 2 drops left eye every 4 hours while awake. Continue warm compresses. Touch base 2-3 days if not resolving

## 2011-09-03 NOTE — Patient Instructions (Signed)
Conjunctivitis Conjunctivitis is commonly called "pink eye." Conjunctivitis can be caused by bacterial or viral infection, allergies, or injuries. There is usually redness of the lining of the eye, itching, discomfort, and sometimes discharge. There may be deposits of matter along the eyelids. A viral infection usually causes a watery discharge, while a bacterial infection causes a yellowish, thick discharge. Pink eye is very contagious and spreads by direct contact. You may be given antibiotic eyedrops as part of your treatment. Before using your eye medicine, remove all drainage from the eye by washing gently with warm water and cotton balls. Continue to use the medication until you have awakened 2 mornings in a row without discharge from the eye. Do not rub your eye. This increases the irritation and helps spread infection. Use separate towels from other household members. Wash your hands with soap and water before and after touching your eyes. Use cold compresses to reduce pain and sunglasses to relieve irritation from light. Do not wear contact lenses or wear eye makeup until the infection is gone. SEEK MEDICAL CARE IF:   Your symptoms are not better after 3 days of treatment.   You have increased pain or trouble seeing.   The outer eyelids become very red or swollen.  Document Released: 07/12/2004 Document Revised: 05/24/2011 Document Reviewed: 06/04/2005 [employer] Patient Information 2012 [employer]. 

## 2011-10-08 ENCOUNTER — Encounter: Payer: Self-pay | Admitting: Family Medicine

## 2011-10-08 ENCOUNTER — Ambulatory Visit (INDEPENDENT_AMBULATORY_CARE_PROVIDER_SITE_OTHER): Payer: BC Managed Care – PPO | Admitting: Family Medicine

## 2011-10-08 VITALS — BP 122/74 | HR 67 | Temp 98.1°F | Wt 149.0 lb

## 2011-10-08 DIAGNOSIS — W57XXXA Bitten or stung by nonvenomous insect and other nonvenomous arthropods, initial encounter: Secondary | ICD-10-CM

## 2011-10-08 DIAGNOSIS — T148 Other injury of unspecified body region: Secondary | ICD-10-CM

## 2011-10-08 DIAGNOSIS — T148XXA Other injury of unspecified body region, initial encounter: Secondary | ICD-10-CM

## 2011-10-08 MED ORDER — DOXYCYCLINE HYCLATE 100 MG PO CAPS: 100.0000 mg | ORAL_CAPSULE | Freq: Two times a day (BID) | ORAL | Status: AC

## 2011-10-08 NOTE — Progress Notes (Signed)
  Subjective:    Patient ID: Julie Daniel, female    DOB: December 02, 1980, 31 y.o.   MRN: [id number]  HPI She noticed a tick embedded on her trunk about 8 days ago, and she pulled it out with forceps. Then 2 days ago the skin around the bite turned red and became painful. No fever or joint pains or HAs.    Review of Systems  Constitutional: Negative.   Respiratory: Negative.   Cardiovascular: Negative.   Skin: Positive for rash.       Objective:   Physical Exam  Constitutional: She appears well-developed and well-nourished.  Skin:       The right upper back over the scapula has a tiny puncture wound surrounded by a macular erythematous annular rash          Assessment & Plan:  Treat with Doxycycline. Use ice packs prn

## 2011-10-22 ENCOUNTER — Encounter: Payer: Self-pay | Admitting: Family

## 2011-10-22 ENCOUNTER — Ambulatory Visit (INDEPENDENT_AMBULATORY_CARE_PROVIDER_SITE_OTHER): Payer: BC Managed Care – PPO | Admitting: Family

## 2011-10-22 VITALS — BP 110/70 | Temp 98.0°F | Wt 152.0 lb

## 2011-10-22 DIAGNOSIS — H00036 Abscess of eyelid left eye, unspecified eyelid: Secondary | ICD-10-CM

## 2011-10-22 DIAGNOSIS — H00039 Abscess of eyelid unspecified eye, unspecified eyelid: Secondary | ICD-10-CM

## 2011-10-22 DIAGNOSIS — R600 Localized edema: Secondary | ICD-10-CM

## 2011-10-22 DIAGNOSIS — R609 Edema, unspecified: Secondary | ICD-10-CM

## 2011-10-22 MED ORDER — SULFAMETHOXAZOLE-TRIMETHOPRIM 800-160 MG PO TABS: 1.0000 | ORAL_TABLET | Freq: Two times a day (BID) | ORAL | Status: AC

## 2011-10-22 NOTE — Progress Notes (Signed)
  Subjective:    Patient ID: Julie Daniel, female    DOB: 1980-08-16, 31 y.o.   MRN: [id number]  HPI 31 year old WF, patient of Dr. Caryl Never is in today with c/o left eye pain and swelling x 2 days. The swelling occurred 2 days after she popped a pimple above her eyelid. She has not taken anything for relief. She denies sneezing, cough, congestion, fatigue, or fever.   Review of Systems  Constitutional: Negative.   Eyes: Positive for pain.       Swelling around the left eye. Red, warm, and tender to touch.   Respiratory: Negative.   Cardiovascular: Negative.   Musculoskeletal: Negative.   Skin: Negative.   Neurological: Negative.   Hematological: Negative.   Psychiatric/Behavioral: Negative.    Past Medical History  Diagnosis Date  . Chicken pox   . GERD (gastroesophageal reflux disease)   . UTI (urinary tract infection)   . History of ovarian cyst      treated woth OCPS  . Hx SBO   . Infertility of tubal origin     hx of blocked tube     History   Social History  . Marital Status: Single    Spouse Name: N/A    Number of Children: N/A  . Years of Education: N/A   Occupational History  . Not on file.   Social History Main Topics  . Smoking status: Current Everyday Smoker -- 0.5 packs/day for 10 years    Types: Cigarettes  . Smokeless tobacco: Never Used  . Alcohol Use: No  . Drug Use: No  . Sexually Active: Not on file   Other Topics Concern  . Not on file   Social History Narrative   Separated Tobacco     No past surgical history on file.  Family History  Problem Relation Age of Onset  . Hypertension Mother   . Sudden death Brother 72  . Diabetes Maternal Grandmother   . Cancer Maternal Grandfather     lung, throat    No Known Allergies  No current outpatient prescriptions on file prior to visit.    BP 110/70  Temp(Src) 98 F (36.7 C) (Oral)  Wt 152 lb (68.947 kg)  LMP 04/15/2013chart    Objective:   Physical Exam  Constitutional:  She is oriented to person, place, and time. She appears well-developed and well-nourished.  Eyes: Conjunctivae are normal.       Left periobital edema. Red, warm, tender to touch.   Neck: Normal range of motion. Neck supple.  Cardiovascular: Normal rate, regular rhythm and normal heart sounds.   Pulmonary/Chest: Effort normal and breath sounds normal.  Abdominal: Soft. Bowel sounds are normal.  Musculoskeletal: Normal range of motion.  Neurological: She is alert and oriented to person, place, and time.  Skin: Skin is warm and dry.  Psychiatric: She has a normal mood and affect.          Assessment & Plan:  Assessment: Periorbital edema, Cellulitis  Plan: Bactrim DS 1 tab twice daily x 10 days. Cool compresses to the left eye. Drink plenty of fluids. Call the office if symptoms worsen or persist. Recheck as scheduled and sooner as needed.

## 2011-10-22 NOTE — Patient Instructions (Signed)

## 2011-11-02 ENCOUNTER — Encounter: Payer: Self-pay | Admitting: Family Medicine

## 2011-11-02 ENCOUNTER — Ambulatory Visit (INDEPENDENT_AMBULATORY_CARE_PROVIDER_SITE_OTHER): Payer: BC Managed Care – PPO | Admitting: Family Medicine

## 2011-11-02 VITALS — BP 110/60 | Temp 98.0°F | Wt 149.0 lb

## 2011-11-02 DIAGNOSIS — R220 Localized swelling, mass and lump, head: Secondary | ICD-10-CM

## 2011-11-02 MED ORDER — FLUCONAZOLE 150 MG PO TABS: 150.0000 mg | ORAL_TABLET | Freq: Once | ORAL | Status: AC

## 2011-11-02 MED ORDER — DOXYCYCLINE HYCLATE 100 MG PO TABS: 100.0000 mg | ORAL_TABLET | Freq: Two times a day (BID) | ORAL | Status: AC

## 2011-11-02 NOTE — Patient Instructions (Signed)
Continue with warm compresses several times a day Touch base/call in one week if swelling no better.

## 2011-11-02 NOTE — Progress Notes (Signed)
  Subjective:    Patient ID: Julie Daniel, female    DOB: 1980-06-26, 31 y.o.   MRN: [id number]  HPI  Recent facial cellulitis. Patient was treated with Septra DS. Her periorbital swelling has improved she still some residual left facial swelling nasolacrimal duct region. This is somewhat firm to palpation. Overall feels well. No fever or chills. No associated pain. No headaches.   Review of Systems  Constitutional: Negative for fever and chills.  Eyes: Negative for visual disturbance.  Neurological: Negative for headaches.       Objective:   Physical Exam  Constitutional: She appears well-developed and well-nourished.  HENT:       Patient is nonfluctuant mild swelling left nasolacrimal region. Minimal erythema. No warmth. Nontender. No cellulitis changes  Cardiovascular: Normal rate and regular rhythm.           Assessment & Plan:  Recent left facial cellulitis. She has some persistent left nasolacrimal area swelling. Warm compresses several times daily. Doxycycline 100 mg twice a day for 10 days. ENT referral if no better one week

## 2011-12-03 ENCOUNTER — Telehealth: Payer: Self-pay | Admitting: Family Medicine

## 2011-12-03 DIAGNOSIS — R220 Localized swelling, mass and lump, head: Secondary | ICD-10-CM

## 2011-12-03 NOTE — Telephone Encounter (Signed)
Patient called stating that she would like to have a referral to the ENT for the bump between her nose and eye. Please assist.

## 2011-12-03 NOTE — Telephone Encounter (Signed)
I agree.  Let pt know I am setting up.

## 2011-12-03 NOTE — Telephone Encounter (Signed)
Pt informed

## 2011-12-04 ENCOUNTER — Other Ambulatory Visit: Payer: Self-pay | Admitting: Otolaryngology

## 2011-12-04 ENCOUNTER — Ambulatory Visit
Admission: RE | Admit: 2011-12-04 | Discharge: 2011-12-04 | Disposition: A | Discharge status: Home / Self Care | Payer: BC Managed Care – PPO | Source: Ambulatory Visit | Attending: Otolaryngology | Admitting: Otolaryngology

## 2011-12-04 DIAGNOSIS — R0981 Nasal congestion: Secondary | ICD-10-CM

## 2011-12-24 HISTORY — PX: NASAL SEPTUM SURGERY: SHX37

## 2012-01-10 ENCOUNTER — Telehealth (INDEPENDENT_AMBULATORY_CARE_PROVIDER_SITE_OTHER): Payer: Self-pay

## 2012-01-10 ENCOUNTER — Encounter (INDEPENDENT_AMBULATORY_CARE_PROVIDER_SITE_OTHER): Payer: Self-pay | Admitting: Surgery

## 2012-01-10 ENCOUNTER — Ambulatory Visit (INDEPENDENT_AMBULATORY_CARE_PROVIDER_SITE_OTHER): Payer: BC Managed Care – PPO | Admitting: Surgery

## 2012-01-10 VITALS — BP 122/74 | HR 75 | Temp 98.6°F | Ht 62.0 in | Wt 155.8 lb

## 2012-01-10 DIAGNOSIS — L0501 Pilonidal cyst with abscess: Secondary | ICD-10-CM | POA: Insufficient documentation

## 2012-01-10 MED ORDER — SULFAMETHOXAZOLE-TRIMETHOPRIM 800-160 MG PO TABS: 1.0000 | ORAL_TABLET | Freq: Two times a day (BID) | ORAL | Status: AC

## 2012-01-10 NOTE — Patient Instructions (Addendum)
Pilonidal Cyst A pilonidal cyst occurs when hairs get trapped (ingrown) beneath the skin in the crease between the buttocks over your sacrum (the bone under that crease). Pilonidal cysts are most common in young men with a lot of body hair. When the cyst is ruptured (breaks) or leaking, fluid from the cyst may cause burning and itching. If the cyst becomes infected, it causes a painful swelling filled with pus (abscess). The pus and trapped hairs need to be removed (often by lancing) so that the infection can heal. However, recurrence is common and an operation may be needed to remove the cyst. HOME CARE INSTRUCTIONS   If the cyst was NOT INFECTED:   Keep the area clean and dry. Bathe or shower daily. Wash the area well with a germ-killing soap. Warm tub baths may help prevent infection and help with drainage. Dry the area well with a towel.   Avoid tight clothing to keep area as moisture free as possible.   Keep area between buttocks as free of hair as possible. A depilatory may be used.   If the cyst WAS INFECTED and needed to be drained:   Your caregiver packed the wound with gauze to keep the wound open. This allows the wound to heal from the inside outwards and continue draining.   Return for a wound check in 1 day or as suggested.   If you take tub baths or showers, repack the wound with gauze following them. Sponge baths (at the sink) are a good alternative.   If an antibiotic was ordered to fight the infection, take as directed.   Only take over-the-counter or prescription medicines for pain, discomfort, or fever as directed by your caregiver.   After the drain is removed, use sitz baths for 20 minutes 4 times per day. Clean the wound gently with mild unscented soap, pat dry, and then apply a dry dressing.  SEEK MEDICAL CARE IF:   You have increased pain, swelling, redness, drainage, or bleeding from the area.   You have a fever.   You have muscles aches, dizziness, or a  general ill feeling.  Document Released: 06/01/2000 Document Revised: 05/24/2011 Document Reviewed: 07/30/2008 [employer] Patient Information 2012 [employer]. 

## 2012-01-10 NOTE — Telephone Encounter (Signed)
RX failed to go thru e-prescribe. Bactrim ds 800-160mg  #20 one po bid called to [facility] [phone] per Dr Ardine Eng request.

## 2012-01-10 NOTE — Progress Notes (Signed)
General Surgery [facility]  Chief Complaint  Patient presents with  . New Evaluation    recurrent pil cyst - referral from Dr. [name]    HISTORY: Patient is a 31 year old white female with recurrent episodes of inflammation involving a pilonidal cyst. Patient had her first episode in 2007. She required incision and drainage. Patient had recurrence in 2009 again requiring incision and drainage. Patient now has intermittent drainage from the pilonidal cyst. She has a small sinus tract. She describes foul smelling drainage. She now presents to consider definitive surgical excision under anesthesia.  Past Medical History  Diagnosis Date  . Chicken pox   . GERD (gastroesophageal reflux disease)   . UTI (urinary tract infection)   . History of ovarian cyst      treated woth OCPS  . Hx SBO   . Infertility of tubal origin     hx of blocked tube      Current Outpatient Prescriptions  Medication Sig Dispense Refill  . sulfamethoxazole-trimethoprim (BACTRIM DS) 800-160 MG per tablet Take 1 tablet by mouth 2 (two) times daily.  20 tablet  0     No Known Allergies   Family History  Problem Relation Age of Onset  . Hypertension Mother   . Sudden death Brother 48  . Diabetes Maternal Grandmother   . Cancer Maternal Grandfather     lung, throat     History   Social History  . Marital Status: Single    Spouse Name: N/A    Number of Children: N/A  . Years of Education: N/A   Social History Main Topics  . Smoking status: Current Everyday Smoker -- 0.5 packs/day for 10 years    Types: Cigarettes  . Smokeless tobacco: Never Used  . Alcohol Use: No  . Drug Use: No  . Sexually Active: None   Other Topics Concern  . None   Social History Narrative   Separated Tobacco      REVIEW OF SYSTEMS - PERTINENT POSITIVES ONLY: Intermittent drainage from natal cleft. Intermittent pain.  Frequent other cutaneous infections.  EXAM: Filed Vitals:   01/10/12 1625  BP: 122/74  Pulse: 75  Temp: 98.6 F (37 C)    HEENT: normocephalic; pupils equal and reactive; sclerae clear; dentition good; mucous membranes moist NECK:  symmetric on extension; no palpable anterior or posterior cervical lymphadenopathy; no supraclavicular masses; no tenderness CHEST: clear to auscultation bilaterally without rales, rhonchi, or wheezes CARDIAC: regular rate and rhythm without significant murmur; peripheral pulses are full GU:  There is a mild folliculitis involving the left buttock. In the midline there is a small sinus tract in the natal cleft. There is no significant drainage. There is no fluctuance. There is mild tenderness. EXT:  non-tender without edema; no deformity NEURO: no gross focal deficits; no sign of tremor   LABORATORY RESULTS: See [facility] ([facility]Epic) for most recent results   RADIOLOGY RESULTS: See [facility] ([facility]pic) for most recent results   IMPRESSION: Recurrent pilonidal cyst, history of abscess drainage  PLAN: The patient and I discussed pilonidal cyst and the surgical management. I provided her with written literature to review.  She is interested in proceeding with definitive surgical excision later this fall. We discussed surgical excision with open packing and twice-daily dressing changes. I explained the length of time that it would take the wound to heal by secondary intention. I discussed restrictions on her activities following the procedure. She understands and wishes to proceed.  The risks and benefits of the procedure have been discussed at length with the patient.  The patient understands the proposed procedure, potential alternative treatments, and the course of recovery to be expected.  All of the patient's questions have been answered at this time.  The patient wishes to proceed with surgery.  Velora Heckler, MD, FACS General & Endocrine Surgery [facility]   Visit  Diagnoses: 1. Pilonidal cyst with abscess     Primary Care Physician: Kristian Covey, MD

## 2012-02-13 ENCOUNTER — Ambulatory Visit (INDEPENDENT_AMBULATORY_CARE_PROVIDER_SITE_OTHER): Payer: BC Managed Care – PPO | Admitting: Family Medicine

## 2012-02-13 ENCOUNTER — Encounter: Payer: Self-pay | Admitting: Family Medicine

## 2012-02-13 VITALS — BP 110/68 | Temp 98.2°F | Wt 152.0 lb

## 2012-02-13 DIAGNOSIS — R300 Dysuria: Secondary | ICD-10-CM

## 2012-02-13 LAB — POCT URINALYSIS DIPSTICK
Bilirubin, UA: NEGATIVE
Ketones, UA: NEGATIVE
Spec Grav, UA: 1.015
pH, UA: 6.5

## 2012-02-13 MED ORDER — NITROFURANTOIN MONOHYD MACRO 100 MG PO CAPS: 100.0000 mg | ORAL_CAPSULE | Freq: Two times a day (BID) | ORAL | Status: AC

## 2012-02-13 NOTE — Progress Notes (Signed)
  Subjective:    Patient ID: Julie Daniel, female    DOB: [birth year], 31 y.o.   MRN: [id number]  HPI  Four-day history of urine frequency and some burning with urination. Suprapubic pressure. Denies any nausea, vomiting, fever, or chills. Also some intermittent left ear fullness. No drainage. No hearing changes. No vertigo.   Review of Systems  Constitutional: Negative for fever, chills and appetite change.  Gastrointestinal: Negative for nausea, vomiting, abdominal pain, diarrhea and constipation.  Genitourinary: Positive for dysuria and frequency.  Musculoskeletal: Negative for back pain.  Neurological: Negative for dizziness.       Objective:   Physical Exam  Constitutional: She appears well-developed and well-nourished.  HENT:  Head: Normocephalic and atraumatic.  Neck: Neck supple. No thyromegaly present.  Cardiovascular: Normal rate, regular rhythm and normal heart sounds.   Pulmonary/Chest: Breath sounds normal.  Abdominal: Soft. Bowel sounds are normal. There is no tenderness.          Assessment & Plan:  Probable uncomplicated cystitis. Macrobid one twice a day for 3 days

## 2012-02-13 NOTE — Patient Instructions (Addendum)

## 2012-07-27 ENCOUNTER — Encounter: Payer: Self-pay | Admitting: Family Medicine

## 2012-07-27 ENCOUNTER — Ambulatory Visit: Payer: BC Managed Care – PPO | Admitting: Family Medicine

## 2012-07-27 VITALS — BP 117/80 | HR 82 | Temp 97.8°F | Resp 16 | Ht 61.5 in | Wt 154.2 lb

## 2012-07-27 DIAGNOSIS — N611 Abscess of the breast and nipple: Secondary | ICD-10-CM

## 2012-07-27 DIAGNOSIS — N61 Mastitis without abscess: Secondary | ICD-10-CM

## 2012-07-27 DIAGNOSIS — B029 Zoster without complications: Secondary | ICD-10-CM

## 2012-07-27 LAB — POCT CBC
Granulocyte percent: 47.6 %G (ref 37–80)
MCH, POC: 31.4 pg — AB (ref 27–31.2)
MCV: 98.3 fL — AB (ref 80–97)
MID (cbc): 0.5 (ref 0–0.9)
POC LYMPH PERCENT: 45.1 %L (ref 10–50)
POC MID %: 7.3 %M (ref 0–12)
Platelet Count, POC: 283 10*3/uL (ref 142–424)
RDW, POC: 12.9 %

## 2012-07-27 MED ORDER — OXYCODONE-ACETAMINOPHEN 5-325 MG PO TABS: 1.0000 | ORAL_TABLET | Freq: Four times a day (QID) | ORAL | Status: DC | PRN

## 2012-07-27 MED ORDER — VALACYCLOVIR HCL 1 G PO TABS: 1000.0000 mg | ORAL_TABLET | Freq: Three times a day (TID) | ORAL | Status: DC

## 2012-07-27 NOTE — Patient Instructions (Addendum)
Shingles Shingles is caused by the same virus that causes chickenpox (varicella zoster virus or VZV). Shingles often occurs many years or decades after having chickenpox. That is why it is more common in adults older than 50 years. The virus reactivates and breaks out as an infection in a nerve root. SYMPTOMS   The initial feeling (sensations) may be pain. This pain is usually described as:  Burning.  Stabbing.  Throbbing.  Tingling in the nerve root.  A red rash will follow in a couple days. The rash may occur in any area of the body and is usually on one side (unilateral) of the body in a band or belt-like pattern. The rash usually starts out as very small blisters (vesicles). They will dry up after 7 to 10 days. This is not usually a significant problem except for the pain it causes.  Long-lasting (chronic) pain is more likely in an elderly person. It can last months to years. This condition is called postherpetic neuralgia. Shingles can be an extremely severe infection in someone with AIDS, a weakened immune system, or with forms of leukemia. It can also be severe if you are taking transplant medicines or other medicines that weaken the immune system. TREATMENT  Your caregiver will often treat you with:  Antiviral drugs.  Anti-inflammatory drugs.  Pain medicines. Bed rest is very important in preventing the pain associated with herpes zoster (postherpetic neuralgia). Application of heat in the form of a hot water bottle or electric heating pad or gentle pressure with the hand is recommended to help with the pain or discomfort. PREVENTION  A varicella zoster vaccine is available to help protect against the virus. The Food and Drug Administration approved the varicella zoster vaccine for individuals 84 years of age and older. HOME CARE INSTRUCTIONS   Cool compresses to the area of rash may be helpful.  Only take over-the-counter or prescription medicines for pain, discomfort, or  fever as directed by your caregiver.  Avoid contact with:  Babies.  Pregnant women.  Children with eczema.  Elderly people with transplants.  People with chronic illnesses, such as leukemia and AIDS.  If the area involved is on your face, you may receive a referral for follow-up to a specialist. It is very important to keep all follow-up appointments. This will help avoid eye complications, chronic pain, or disability. SEEK IMMEDIATE MEDICAL CARE IF:   You develop any pain (headache) in the area of the face or eye. This must be followed carefully by your caregiver or ophthalmologist. An infection in part of your eye (cornea) can be very serious. It could lead to blindness.  You do not have pain relief from prescribed medicines.  Your redness or swelling spreads.  The area involved becomes very swollen and painful.  You have a fever.  You notice any red or painful lines extending away from the affected area toward your heart (lymphangitis).  Your condition is worsening or has changed. Document Released: 06/04/2005 Document Revised: 08/27/2011 Document Reviewed: 05/09/2009 [facility] Patient Information 2013 [place], [place]. Breast Cyst A breast cyst is a sac in your breast that is filled with fluid. This is common in women. Women can have one or many cysts. When the breasts contain many cysts, it is usually due to a noncancerous (benign) condition called fibrocystic change. These lumps form under the influence of female hormones (estrogen and progesterone). The lumps are most often located in the upper, outer portion of the breast. They are often more swollen, painful,  and tender before your period starts. They usually disappear after menopause, unless you are on hormone therapy. Different types of cysts:  Macrocysts. These are cysts that are about 2 inches (5.1 cm) in diameter.  Microcysts. These are tiny cysts that you cannot feel, but that are seen with a mammogram or an  ultrasound.  Galactocele. This is a cyst containing milk, which develops when and if you suddenly stop breastfeeding.  Sebaceous cyst of the skin (not in the breast tissue itself). These are not cancerous. Breast cysts do not increase your chance of getting breast cancer. However, they must be followed and treated closely, because a cyst can be cancerous. Be sure to see your caregiver for follow-up care as recommended.  CAUSES   It is not completely known what causes a breast cyst.  Estrogen may influence the development of a breast cyst.  An overgrowth of milk glands and connective tissue in the breast can block the milk glands, causing them to fill with fluid.  Scar tissue in the breast from previous surgery may block the glands, causing a cyst. SYMPTOMS   Feeling a smooth, round, soft lump (like a grape) in the breast that is easily moveable.  Breast discomfort or pain, especially in the area of the cyst.  Increase in size of the lump before your menstrual period, and decrease in size after your menstrual period. DIAGNOSIS   The cyst can be felt during an exam by your caregiver.  Mammogram (breast X-ray).  Ultrasound.  Removing fluid from the cyst with a needle (fine needle aspiration). TREATMENT   Your caregiver may feel there is no reason for treatment. He or she may watch to see if it goes away on its own.  Hormone treatment.  Needle aspiration. There is a 40% chance of the cyst recurring after aspiration.  Surgery to remove the whole cyst. HOME CARE INSTRUCTIONS   Get a yearly exam by your caregiver.  Practice "breast self-awareness." This means understanding the normal appearance and feel of your breasts and may include breast self-examination.  Have a clinical breast exam (CBE) by a caregiver every 1 to 3 years if you are 62 to 30 years of age. After age 47, you should have a CBE every year.  Get mammogram tests as directed by your caregiver.  Only take  over-the-counter pain medicine as directed by your caregiver.  Wear a good support bra, especially when exercising.  Avoid caffeine.  Reduce your salt intake, especially before your menstrual period. Too much salt can cause fluid retention, breast swelling, and discomfort. SEEK MEDICAL CARE IF:   You feel, or think you feel, a lump in your breast.  You notice that both breasts look different than usual.  You notice that both breasts feel different than before.  Your breast is still causing pain, after your menstrual period is over.  You need medicine for breast pain and swelling that occurs with your menstrual period. SEEK IMMEDIATE MEDICAL CARE IF:   You develop severe pain, tenderness, redness, or warmth in your breast.  You develop nipple discharge or bleeding.  Your breast lump becomes hard and painful.  You find new lumps or bumps that were not there before.  You feel lumps in your armpit (axilla).  You notice dimpling or wrinkling of the breast or nipple.  You have a fever. Document Released: 06/04/2005 Document Revised: 08/27/2011 Document Reviewed: 09/24/2008 [facility] Patient Information 2013 [place], [place].

## 2012-07-27 NOTE — Progress Notes (Signed)
Subjective:    Patient ID: Julie Daniel, female    DOB: 1980/07/20, 32 y.o.   MRN: [id number] Chief Complaint  Patient presents with  . Rash    under right arm x 3 days    HPI  Rash - she thought it was from a razor burn when she first noticed 3d ago - same size but has gotten darker - and now she has noticed that it actually looks lilke a collection of blisters. Has been putting topical baby powder - tried A&D ointment but that made it react - "it didn't like it."  Is not draining anything and has dried it out w/ the baby powder - never had anything like this prior.  It is very painful - she thinks from her arm rubbing against it. She has noticed another cluster of papules that developed today on her upper outer right breast - immed anterior to her rash - that is itching - she thinks how the current lesions started.  Did have chicken pox when she was a little kid.  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  Has had many MRSA abscesses requiring I&D in the past.  For the past several mos, she has had a left breast cyst which began draining several wk ago and just won't heal up. She initially squeezed it and had a lot drain out, has continued to have a little drain since but now scab keeps coming off.    Past Medical History  Diagnosis Date  . Chicken pox   . GERD (gastroesophageal reflux disease)   . UTI (urinary tract infection)   . History of ovarian cyst      treated woth OCPS  . Hx SBO   . Infertility of tubal origin     hx of blocked tube    No current outpatient prescriptions on file prior to visit.   No current facility-administered medications on file prior to visit.   No Known Allergies  Review of Systems  Constitutional: Negative for fever, chills, diaphoresis and activity change.  Musculoskeletal: Positive for myalgias. Negative for joint swelling and gait problem.  Skin: Positive for color change, rash and wound. Negative for pallor.   Neurological: Negative for numbness.  Hematological: Negative for adenopathy. Does not bruise/bleed easily.  Psychiatric/Behavioral: Positive for sleep disturbance.      BP 117/80  Pulse 82  Temp(Src) 97.8 F (36.6 C) (Oral)  Resp 16  Ht 5' 1.5" (1.562 m)  Wt 154 lb 3.2 oz (69.945 kg)  BMI 28.67 kg/m2  SpO2 100% Objective:   Physical Exam  Constitutional: She is oriented to person, place, and time. She appears well-developed and well-nourished. No distress.  HENT:  Head: Normocephalic and atraumatic.  Eyes: Conjunctivae are normal. No scleral icterus.  Pulmonary/Chest: Effort normal.  Musculoskeletal: She exhibits no edema.  Neurological: She is alert and oriented to person, place, and time.  Skin: Skin is warm and dry. Lesion and rash noted. Rash is vesicular. She is not diaphoretic. There is erythema.  In chest wall side of right axilla is 3 cm dm round cluster of crusted vesicles on erythematous base with few pustules spread posteriorly and a cluster of erythematous non-blanchable macules that pt had not noticed prior and itched a lot anteriorly in same dermatomal distribution. Nothing on back or neck. 2 lesions unroofed and sent for clx.  On left breast immed outside areola at 9 o'clock position is small scab surrounded by <1cm round induration, no  warmth or fluctuance. Severe pressure applied with a small amount of serosanguinous drainage that was sent for culture  Psychiatric: She has a normal mood and affect. Her behavior is normal.          Results for orders placed in visit on 07/27/12  POCT CBC      Result Value Range   WBC 7.1  4.6 - 10.2 K/uL   Lymph, poc 3.2  0.6 - 3.4   POC LYMPH PERCENT 45.1  10 - 50 %L   MID (cbc) 0.5  0 - 0.9   POC MID % 7.3  0 - 12 %M   POC Granulocyte 3.4  2 - 6.9   Granulocyte percent 47.6  37 - 80 %G   RBC 4.33  4.04 - 5.48 M/uL   Hemoglobin 13.6  12.2 - 16.2 g/dL   HCT, POC 16.1  09.6 - 47.9 %   MCV 98.3 (*) 80 - 97 fL   MCH,  POC 31.4 (*) 27 - 31.2 pg   MCHC 31.9  31.8 - 35.4 g/dL   RDW, POC 04.5     Platelet Count, POC 283  142 - 424 K/uL   MPV 11.0  0 - 99.8 fL   Assessment & Plan:   1. Shingles - pt very young so lesion sent for culture and labs drawn. valACYclovir (VALTREX) 1000 MG tablet   valACYclovir (VALTREX) 1000 MG tablet   oxyCODONE-acetaminophen (ROXICET) 5-325 MG per tablet   POCT CBC   Comprehensive metabolic panel   HIV antibody   Wound culture   Herpes simplex virus culture  2. Abscess of breast - cont hot compresses with topical bactroban to allow to drain fully then heal hopefully by secondary intention. rtc if grow larger as may need abx. POCT CBC   POCT CBC   Comprehensive metabolic panel   HIV antibody   Wound culture   Herpes simplex virus culture   Meds ordered this encounter  Medications  . valACYclovir (VALTREX) 1000 MG tablet    Sig: Take 1 tablet (1,000 mg total) by mouth 3 (three) times daily.    Dispense:  21 tablet    Refill:  0  . oxyCODONE-acetaminophen (ROXICET) 5-325 MG per tablet    Sig: Take 1 tablet by mouth every 6 (six) hours as needed for pain.    Dispense:  30 tablet    Refill:  0

## 2012-07-28 LAB — COMPREHENSIVE METABOLIC PANEL
AST: 11 U/L (ref 0–37)
Alkaline Phosphatase: 65 U/L (ref 39–117)
BUN: 11 mg/dL (ref 6–23)
Creat: 0.85 mg/dL (ref 0.50–1.10)
Potassium: 4.5 mEq/L (ref 3.5–5.3)

## 2012-07-28 MED ORDER — MUPIROCIN 2 % EX OINT: TOPICAL_OINTMENT | Freq: Three times a day (TID) | CUTANEOUS | Status: DC

## 2012-07-30 LAB — HERPES SIMPLEX VIRUS CULTURE: Organism ID, Bacteria: NOT DETECTED

## 2012-07-31 LAB — WOUND CULTURE
Gram Stain: NONE SEEN
Gram Stain: NONE SEEN

## 2012-11-11 ENCOUNTER — Encounter: Payer: Self-pay | Admitting: Internal Medicine

## 2012-11-11 ENCOUNTER — Telehealth: Payer: Self-pay

## 2012-11-11 ENCOUNTER — Ambulatory Visit (INDEPENDENT_AMBULATORY_CARE_PROVIDER_SITE_OTHER): Payer: BC Managed Care – PPO | Admitting: Internal Medicine

## 2012-11-11 VITALS — BP 114/80 | HR 88 | Temp 97.9°F | Wt 151.0 lb

## 2012-11-11 DIAGNOSIS — J020 Streptococcal pharyngitis: Secondary | ICD-10-CM | POA: Insufficient documentation

## 2012-11-11 DIAGNOSIS — N6009 Solitary cyst of unspecified breast: Secondary | ICD-10-CM | POA: Insufficient documentation

## 2012-11-11 DIAGNOSIS — J029 Acute pharyngitis, unspecified: Secondary | ICD-10-CM

## 2012-11-11 DIAGNOSIS — N6002 Solitary cyst of left breast: Secondary | ICD-10-CM

## 2012-11-11 LAB — POCT RAPID STREP A (OFFICE): Rapid Strep A Screen: POSITIVE — AB

## 2012-11-11 MED ORDER — CEPHALEXIN 500 MG PO CAPS: 500.0000 mg | ORAL_CAPSULE | Freq: Two times a day (BID) | ORAL | Status: DC

## 2012-11-11 NOTE — Telephone Encounter (Signed)
Pt is wanting to know what the name of the cream she was given by dr Clelia Croft when she came in for an abcess on her breast   Best number [phone]

## 2012-11-11 NOTE — Progress Notes (Signed)
Chief Complaint  Patient presents with  . Sore Throat    Pt  believes she has pus pockets in her throat.  Has had fever, vomiting and diarrhea.    HPI: Patient comes in today for SDA for  new problem evaluation. PCP NA Patient comes in with a few days of sore throat history of fever had nausea diarrhea last week and now has swollen glands in her neck. She has a history of strep throat a year ago. No unusual rashes syncope shortness of breath.  She also is having continued problems with drainage from the left breast skin area is been going off and on almost a year. She seen in urgent care and was given a topical antibiotic she states that it will drain if she squeezes it makes tubes a thick discharge. But it always fills back up again and he get as big as an almond size. Or more.   She keeps using the Bactroban on it but is expensive and problem keeps coming back Works at [employer]. ROS: See pertinent positives and negatives per HPI. Negative for chest pain shortness of breath history of mono  Past Medical History  Diagnosis Date  . Chicken pox   . GERD (gastroesophageal reflux disease)   . UTI (urinary tract infection)   . History of ovarian cyst      treated woth OCPS  . Hx SBO   . Infertility of tubal origin     hx of blocked tube     Family History  Problem Relation Age of Onset  . Hypertension Mother   . Sudden death Brother 9  . Diabetes Maternal Grandmother   . Cancer Maternal Grandfather     lung, throat    History   Social History  . Marital Status: Single    Spouse Name: N/A    Number of Children: N/A  . Years of Education: N/A   Social History Main Topics  . Smoking status: Current Every Day Smoker -- 0.50 packs/day for 10 years    Types: Cigarettes  . Smokeless tobacco: Never Used  . Alcohol Use: No  . Drug Use: No  . Sexually Active: None   Other Topics Concern  . None   Social History Narrative   Separated    Tobacco     Outpatient Encounter  Prescriptions as of 11/11/2012  Medication Sig Dispense Refill  . cephALEXin (KEFLEX) 500 MG capsule Take 1 capsule (500 mg total) by mouth 2 (two) times daily.  20 capsule  0  . [DISCONTINUED] mupirocin ointment (BACTROBAN) 2 % Apply topically 3 (three) times daily.  22 g  1  . [DISCONTINUED] oxyCODONE-acetaminophen (ROXICET) 5-325 MG per tablet Take 1 tablet by mouth every 6 (six) hours as needed for pain.  30 tablet  0  . [DISCONTINUED] valACYclovir (VALTREX) 1000 MG tablet Take 1 tablet (1,000 mg total) by mouth 3 (three) times daily.  21 tablet  0   No facility-administered encounter medications on file as of 11/11/2012.    EXAM:  BP 114/80  Pulse 88  Temp(Src) 97.9 F (36.6 C) (Oral)  Wt 151 lb (68.493 kg)  BMI 28.07 kg/m2  SpO2 98%  LMP 10/28/2012  Body mass index is 28.07 kg/(m^2).  GENERAL: vitals reviewed and listed above, alert, oriented, appears well hydrated and in no acute distress  Has some nasal congestion nontoxic  HEENT: atraumatic, conjunctiva  clear, no obvious abnormalities on inspection of external nose and ears TMs are intact face nontender  OP : +2 tonsils slight exudate one side good airway  NECK: Supple massive +3 a.c. nodes shotty to negative PC nodes LUNGS: clear to auscultation bilaterally, no wheezes, rales or rhonchi, good air movement Abdomen soft without organomegaly guarding or rebound CV: HRRR, no clubbing cyanosis or  peripheral edema nl cap refill  Breast left there is a very small poor that expressed thick yellow pus-looking drainage with minimal erythema around it I don't really feel much of a mass effect on palpation and this is at the areola at about 9:00. No obvious other breast mass. No obvious adenopathy drainage was sent for routine bacterial culture MS: moves all extremities without noticeable focal  abnormality PSYCH: pleasant and cooperative, no obvious depression or anxiety  ASSESSMENT AND PLAN:  Discussed the following assessment  and plan:  Acute streptococcal pharyngitis - with severe anterior adenopathy  no pc nodes like mono.   Sore throat - Plan: POC Rapid Strep A, CANCELED: Throat culture ([facility])  Breast cyst, left - apparently draining off nad on for months and not resol;ved with antibiotic ointmendt no mass effect today small amount of pus   yellow thick cultured - Plan: Wound culture Because she had a history of GI upset last week we'll use Keflex instead of Augmentin for broader spectrum antibiotic -Patient advised to return or notify health care team  if symptoms worsen or persist or new concerns arise.  Patient Instructions  You have strep throat infection that could be causing nausea sore throat swollen glands and fever.  We are using a broader spectrum antibiotic to see if that will help The discharge from the breast area that seems to be an infected cyst.  Warm compresses on the left breast in the meantime.  If you're swollen glands and fever or not significantly better in 2 days on the antibiotic contact our office.  I will let Dr. Caryl Never look at the note .  And tell him about the recurrent drainage from the left breast. Although it seems very small today would consider having a surgeon look at this area.  We'll let you  know when the wound culture results are complete    Burna Mortimer K. Ahni Bradwell M.D.

## 2012-11-11 NOTE — Telephone Encounter (Signed)
Bactroban.  Patient advised.

## 2012-11-11 NOTE — Patient Instructions (Signed)
You have strep throat infection that could be causing nausea sore throat swollen glands and fever.  We are using a broader spectrum antibiotic to see if that will help The discharge from the breast area that seems to be an infected cyst.  Warm compresses on the left breast in the meantime.  If you're swollen glands and fever or not significantly better in 2 days on the antibiotic contact our office.  I will let Dr. Caryl Never look at the note .  And tell him about the recurrent drainage from the left breast. Although it seems very small today would consider having a surgeon look at this area.  We'll let you  know when the wound culture results are complete

## 2012-11-18 NOTE — Progress Notes (Signed)
Quick Note:  Called and spoke with pt and pt is aware. ______ 

## 2012-12-15 ENCOUNTER — Ambulatory Visit (INDEPENDENT_AMBULATORY_CARE_PROVIDER_SITE_OTHER): Payer: BC Managed Care – PPO | Admitting: Family Medicine

## 2012-12-15 ENCOUNTER — Encounter: Payer: Self-pay | Admitting: Family Medicine

## 2012-12-15 VITALS — BP 104/70 | Temp 98.6°F | Wt 152.0 lb

## 2012-12-15 DIAGNOSIS — N6009 Solitary cyst of unspecified breast: Secondary | ICD-10-CM

## 2012-12-15 DIAGNOSIS — N6002 Solitary cyst of left breast: Secondary | ICD-10-CM

## 2012-12-15 DIAGNOSIS — B070 Plantar wart: Secondary | ICD-10-CM

## 2012-12-15 NOTE — Progress Notes (Signed)
Chief Complaint  Patient presents with  . Foot Pain    HPI:  Acute visit for foot pain: -started about 1 year ago - thinks might have a plantar wart -R foot - hurts when walks on it  ROS: See pertinent positives and negatives per HPI.  Past Medical History  Diagnosis Date  . Chicken pox   . GERD (gastroesophageal reflux disease)   . UTI (urinary tract infection)   . History of ovarian cyst      treated woth OCPS  . Hx SBO   . Infertility of tubal origin     hx of blocked tube     Family History  Problem Relation Age of Onset  . Hypertension Mother   . Sudden death Brother 5  . Diabetes Maternal Grandmother   . Cancer Maternal Grandfather     lung, throat    History   Social History  . Marital Status: Single    Spouse Name: N/A    Number of Children: N/A  . Years of Education: N/A   Social History Main Topics  . Smoking status: Current Every Day Smoker -- 0.50 packs/day for 10 years    Types: Cigarettes  . Smokeless tobacco: Never Used  . Alcohol Use: No  . Drug Use: No  . Sexually Active: None   Other Topics Concern  . None   Social History Narrative   Separated    Tobacco     No current outpatient prescriptions on file.  EXAM:  Filed Vitals:   12/15/12 1532  BP: 104/70  Temp: 98.6 F (37 C)    Body mass index is 28.26 kg/(m^2).  GENERAL: vitals reviewed and listed above, alert, oriented, appears well hydrated and in no acute distress  HEENT: atraumatic, conjunttiva clear, no obvious abnormalities on inspection of external nose and ears  NECK: no obvious masses on inspection  SKIN: large plantar wart L plantar foot, smaller plantar wart on L 2 digit - draining small opening R breast in areolar region, small area of nodular density in region  MS: moves all extremities without noticeable abnormality  PSYCH: pleasant and cooperative, no obvious depression or anxiety  ASSESSMENT AND PLAN:  Discussed the following assessment and  plan:  Breast cyst, left -mentioned this as leaving, reported ongoing with drainage for about 1 year despite tx with abx, reports seen by different doctors for this with cultures performed -per notes from one month ago was to see surgeon if not better on abx - pt wants to see surgeon, referral placed -follow up with PCP  Plantar wart -large on plantar surface of R foot -discussed options - she wanted to do trial of debridement and LN2 - performed today -disucssed given large size of lesion will likely need multiple LN2 tx or medication  -follow up in 2-4 weeks  -Patient advised to return or notify a doctor immediately if symptoms worsen or persist or new concerns arise.  Patient Instructions  Follow up with your doctor in 2-4 weeks regarding plantar wart or sooner if worsening or signs of infection  -We placed a referral for you as discussed to the surgeon for your breast cyst. It usually takes about 1-2 weeks to process and schedule this referral. If you have not heard from [place] regarding this appointment in 2 weeks please contact our office.      Kriste Basque R.

## 2012-12-15 NOTE — Patient Instructions (Signed)
Follow up with your doctor in 2-4 weeks regarding plantar wart or sooner if worsening or signs of infection  -We placed a referral for you as discussed to the surgeon for your breast cyst. It usually takes about 1-2 weeks to process and schedule this referral. If you have not heard from [place] regarding this appointment in 2 weeks please contact our office.

## 2012-12-29 ENCOUNTER — Encounter: Payer: Self-pay | Admitting: Family Medicine

## 2012-12-29 ENCOUNTER — Ambulatory Visit (INDEPENDENT_AMBULATORY_CARE_PROVIDER_SITE_OTHER): Payer: BC Managed Care – PPO | Admitting: Family Medicine

## 2012-12-29 VITALS — BP 118/70 | HR 73 | Temp 98.0°F | Wt 150.0 lb

## 2012-12-29 DIAGNOSIS — L84 Corns and callosities: Secondary | ICD-10-CM

## 2012-12-29 NOTE — Progress Notes (Signed)
  Subjective:    Patient ID: Julie Daniel, female    DOB: 1981-01-31, 32 y.o.   MRN: [id number]  HPI Right foot pain. Seen 2 weeks ago with trimming of possible plantar wart and subsequent liquid nitrogen. Initially saw some improvement and now has recurrent pain in the same region. No prior history of plantar warts. She has altered her gait because of the foot pain and does have occasional knee and hip pains on that side.  Past Medical History  Diagnosis Date  . Chicken pox   . GERD (gastroesophageal reflux disease)   . UTI (urinary tract infection)   . History of ovarian cyst      treated woth OCPS  . Hx SBO   . Infertility of tubal origin     hx of blocked tube    Past Surgical History  Procedure Laterality Date  . Nasal septum surgery  12/24/11    reports that she has been smoking Cigarettes.  She has a 5 pack-year smoking history. She has never used smokeless tobacco. She reports that she does not drink alcohol or use illicit drugs. family history includes Cancer in her maternal grandfather; Diabetes in her maternal grandmother; Hypertension in her mother; and Sudden death (age of onset: 37) in her brother. No Known Allergies    Review of Systems  Musculoskeletal: Positive for gait problem.       Objective:   Physical Exam  Constitutional: She appears well-developed and well-nourished.  Cardiovascular: Normal rate.   Pulmonary/Chest: Effort normal and breath sounds normal. No respiratory distress. She has no wheezes. She has no rales.  Skin:  Right foot laterally along the mid foot reveals thickened callused area. Use #15 blade and trimmed down this region. Patient felt better immediately afterwards          Assessment & Plan:  Callus right foot. There is some question of plantar wart though we could not see any black specks with exception of one lateral border. Most the tissue seemed to be more callused. Discussed measures to try to reduce recurrence. May  ultimately need orthotics

## 2012-12-29 NOTE — Patient Instructions (Addendum)

## 2013-01-06 ENCOUNTER — Other Ambulatory Visit (INDEPENDENT_AMBULATORY_CARE_PROVIDER_SITE_OTHER): Payer: Self-pay | Admitting: Surgery

## 2013-01-06 ENCOUNTER — Ambulatory Visit (INDEPENDENT_AMBULATORY_CARE_PROVIDER_SITE_OTHER): Payer: BC Managed Care – PPO | Admitting: Surgery

## 2013-01-06 ENCOUNTER — Encounter (INDEPENDENT_AMBULATORY_CARE_PROVIDER_SITE_OTHER): Payer: Self-pay | Admitting: Surgery

## 2013-01-06 VITALS — BP 110/66 | HR 72 | Temp 99.3°F | Resp 16 | Ht 62.0 in | Wt 151.6 lb

## 2013-01-06 DIAGNOSIS — N6009 Solitary cyst of unspecified breast: Secondary | ICD-10-CM

## 2013-01-06 DIAGNOSIS — N6002 Solitary cyst of left breast: Secondary | ICD-10-CM

## 2013-01-06 NOTE — Patient Instructions (Signed)
Galactorrhea Galactorrhea is when there is a milky nipple discharge. It is different from normal milk in nursing mothers. It usually comes from both nipples. Galactorrhea is not a disease but may be a symptom of a problem. It may continue for years after weaning. Galactorrhea is caused by the hormone prolactin, which stimulates milk production. If the breast discharge looks like pus, is bloody or if there is a lump present in the affected breast, the discharge may be caused by other problems including:  A benign cyst.  Papilloma.  Breast cancer.  A breast infection.  A breast abscess. It can also be seen in men who have a low or absent female hormone (testosterone) level. Galactorrhea can be present in a newborn if the mother had high female hormone (estrogen) levels that crossed into the baby through the placenta. The baby usually has enlarged breasts, but in time, it all goes away on its own. CAUSES   Tumor of the pituitary gland in the brain.  Problems with the hypothalamus in the brain that stimulates the pituitary gland.  Low thyroid function (hypothyroid disease).  Chronic kidney failure.  Medications, antidepressants, tranquilizers and blood pressure medication.  Herbal medications (nettle, fennel, blessed thistle, anise and fenugreek seed).  Illegal drugs (marijuana and opiates).  Breast stimulation during sexual activity or too many and frequent self breast exams.  Birth control pills.  Surgery or trauma to the breast causing nerve damage.  Spinal cord injury. SYMPTOMS   White, yellow or green discharge from one or both breasts.  No menstrual period (amenorrhea) or infrequent menstrual periods (hypomenorrhea).  Hot flashes, lack of sexual desire or vaginal dryness.  Infertility in women and men.  Headaches and vision problems.  Decrease in calcium in your bones (developing osteopenia or osteoporosis). DIAGNOSIS  Your caregiver may be able to know your problem  by taking a detailed history and physical exam of you. Tests that may be done, include:  Blood tests to check for the prolactin hormone, your female and thyroid hormones and a pregnancy test.  A detailed eye exam.  Mammogram.  X-rays, CT scan or MRI of breasts or your brain looking for a tumor. TREATMENT   Stopping medications that may be causing the galactorrhea.  Treating low thyroid function with thyroid hormones.  Medical or surgical (if necessary) treatment of a pituitary gland tumor.  Medication to lower the prolactin hormone level when no cause can be found.  Surgery as a last resort to remove the breasts ducts if the discharge persists with treatment and is a problem.  Treatment may not be necessary if you are not bothered by the breast discharge. HOME CARE INSTRUCTIONS   Before seeing your caregiver, make a list of all your symptoms, medications, when the breast discharge started and questions you may have.  Avoid breast stimulation during sexual activity.  Perform breast self exam once a month.  Avoid clothes that rub on your nipples.  Use breasts pads to absorb the discharge.  Wear a support bra. SEEK MEDICAL CARE IF:   You have galactorrhea and you are trying to get pregnant.  You develop hot flashes, vaginal dryness or lack of sexual desire.  You stop having menstrual periods or they are irregular or far apart.  You have headaches.  You have vision problems. SEEK IMMEDIATE MEDICAL CARE IF:   Your breast discharge is bloody or pus-like.  You have breast pain.  You feel a lump in your breast.  Your breast shows wrinkling or   dimpling.  Your breast becomes red and swollen. Document Released: 07/12/2004 Document Revised: 08/27/2011 Document Reviewed: 05/25/2008 [employer] Patient Information 2014 [employer].  

## 2013-01-06 NOTE — Progress Notes (Signed)
General Surgery [facility]  Chief Complaint  Patient presents with  . New Evaluation    evaluate left breast cyst - referral from Dr. Kriste Basque    HISTORY: Patient is a 32 year old female referred with a one-year history of intermittent drainage from the left breast areola. Patient has noted a yellow fluid both from the nipple and from a small opening just medial to the nipple on multiple occasions. She can express this fluid essentially on a daily basis. She has had 2 cultures obtained which showed multiple bacterial species. She has had no diagnostic studies performed by radiology.  Patient has no prior history of breast disease. She has never had a breast biopsy. There is no family history of breast disease.  Past Medical History  Diagnosis Date  . Chicken pox   . GERD (gastroesophageal reflux disease)   . UTI (urinary tract infection)   . History of ovarian cyst      treated woth OCPS  . Hx SBO   . Infertility of tubal origin     hx of blocked tube     No current outpatient prescriptions on file.   No current facility-administered medications for this visit.    No Known Allergies  Family History  Problem Relation Age of Onset  . Hypertension Mother   . Sudden death Brother 79  . Diabetes Maternal Grandmother   . Cancer Maternal Grandfather     lung, throat    History   Social History  . Marital Status: Single    Spouse Name: N/A    Number of Children: N/A  . Years of Education: N/A   Social History Main Topics  . Smoking status: Current Every Day Smoker -- 0.50 packs/day for 10 years    Types: Cigarettes  . Smokeless tobacco: Never Used  . Alcohol Use: No  . Drug Use: No  . Sexually Active: None   Other Topics Concern  . None   Social History Narrative   Separated    Tobacco     REVIEW OF SYSTEMS - PERTINENT POSITIVES ONLY: Intermittent drainage from left areola and nipple, yellow in color.  EXAM: Filed Vitals:    01/06/13 1633  BP: 110/66  Pulse: 72  Temp: 99.3 F (37.4 C)  Resp: 16    HEENT: normocephalic; pupils equal and reactive; sclerae clear; dentition good; mucous membranes moist NECK:  symmetric on extension; no palpable anterior or posterior cervical lymphadenopathy; no supraclavicular masses; no tenderness CHEST: clear to auscultation bilaterally without rales, rhonchi, or wheezes CARDIAC: regular rate and rhythm without significant murmur; peripheral pulses are full BREAST: Right breast with normal nipple areolar complex; right breast parenchyma diffusely nodular without discrete or dominant mass; no axillary adenopathy; left breast shows a small punctate area of ulceration just medial to the nipple measuring approximately 2-3 mm in diameter; with compression there is thick yellow drainage from the site; there is also a small amount of yellow drainage from the nipple; after expression there is a small amount of blood from the site on the medial areola; breast parenchyma is diffusely nodular without discrete or dominant mass; left axilla is free of adenopathy EXT:  non-tender without edema; no deformity NEURO: no gross focal deficits; no sign of tremor   LABORATORY RESULTS: See [facility] (CHL-Epic) for most recent results  RADIOLOGY RESULTS: See [facility] (CHL-Epic) for most recent results  IMPRESSION: Left breast drainage from her areola, suspect epidermal inclusion cyst versus intraductal papilloma  PLAN:  Patient will be referred to the breast center of [facility] for diagnostic testing. I have requested a left breast ultrasound. We will contact the patient with these results. Hopefully this is a limited process and may require only simple excision under sedation and local anesthesia. Patient and I discussed possible operative procedures. We will contact her once the results of her diagnostic test are available.  Velora Heckler, MD, FACS General & Endocrine  Surgery [facility]  Primary Care Physician: Kristian Covey, MD

## 2013-01-20 ENCOUNTER — Ambulatory Visit
Admission: RE | Admit: 2013-01-20 | Discharge: 2013-01-20 | Disposition: A | Discharge status: Home / Self Care | Payer: BC Managed Care – PPO | Source: Ambulatory Visit | Attending: Surgery | Admitting: Surgery

## 2013-01-20 DIAGNOSIS — N6002 Solitary cyst of left breast: Secondary | ICD-10-CM

## 2013-01-28 ENCOUNTER — Other Ambulatory Visit (INDEPENDENT_AMBULATORY_CARE_PROVIDER_SITE_OTHER): Payer: Self-pay | Admitting: Surgery

## 2013-01-28 DIAGNOSIS — N6002 Solitary cyst of left breast: Secondary | ICD-10-CM

## 2013-01-28 NOTE — Progress Notes (Signed)
Surgery posting sheet completed and to surgery schedulers.

## 2013-01-28 NOTE — Progress Notes (Signed)
Results of the bilateral mammogram and left breast ultrasound are reviewed. No evidence of underlying breast disease.  Patient will require excision of the cyst from the left areola under sedation and local anesthesia as an outpatient surgical procedure. This was discussed during her last office visit.  Orders are now entered for scheduling her outpatient surgery.  The risks and benefits of the procedure have been discussed at length with the patient.  The patient understands the proposed procedure, potential alternative treatments, and the course of recovery to be expected.  All of the patient's questions have been answered at this time.  The patient wishes to proceed with surgery.  Velora Heckler, MD, [facility] Office: [phone]

## 2013-01-29 ENCOUNTER — Telehealth (INDEPENDENT_AMBULATORY_CARE_PROVIDER_SITE_OTHER): Payer: Self-pay | Admitting: Surgery

## 2013-01-29 NOTE — Telephone Encounter (Signed)
Pt to call when ready to schedule surgery. Aware of financial obligation. Aware 90 days orders

## 2013-02-02 NOTE — Telephone Encounter (Signed)
Ultrasound and mammogram findings are benign.  Will schedule for excision of cyst from areola at time convenient for patient.  Velora Heckler, MD, [facility] Office: [phone]

## 2013-03-27 ENCOUNTER — Encounter: Payer: Self-pay | Admitting: Family Medicine

## 2013-03-27 ENCOUNTER — Ambulatory Visit (INDEPENDENT_AMBULATORY_CARE_PROVIDER_SITE_OTHER): Payer: BC Managed Care – PPO | Admitting: Family Medicine

## 2013-03-27 ENCOUNTER — Other Ambulatory Visit (HOSPITAL_COMMUNITY)
Admission: RE | Admit: 2013-03-27 | Discharge: 2013-03-27 | Disposition: A | Discharge status: Home / Self Care | Payer: BC Managed Care – PPO | Source: Ambulatory Visit | Attending: Family Medicine | Admitting: Family Medicine

## 2013-03-27 VITALS — BP 132/80 | HR 63 | Temp 97.9°F | Wt 156.0 lb

## 2013-03-27 DIAGNOSIS — R102 Pelvic and perineal pain: Secondary | ICD-10-CM

## 2013-03-27 DIAGNOSIS — N949 Unspecified condition associated with female genital organs and menstrual cycle: Secondary | ICD-10-CM

## 2013-03-27 DIAGNOSIS — N926 Irregular menstruation, unspecified: Secondary | ICD-10-CM

## 2013-03-27 DIAGNOSIS — R319 Hematuria, unspecified: Secondary | ICD-10-CM

## 2013-03-27 DIAGNOSIS — Z01419 Encounter for gynecological examination (general) (routine) without abnormal findings: Secondary | ICD-10-CM | POA: Insufficient documentation

## 2013-03-27 LAB — CBC WITH DIFFERENTIAL/PLATELET
Eosinophils Relative: 1 % (ref 0–5)
HCT: 40.2 % (ref 36.0–46.0)
Lymphocytes Relative: 21 % (ref 12–46)
Lymphs Abs: 2.5 10*3/uL (ref 0.7–4.0)
MCV: 95.5 fL (ref 78.0–100.0)
Monocytes Absolute: 0.5 10*3/uL (ref 0.1–1.0)
Platelets: 275 10*3/uL (ref 150–400)
RBC: 4.21 MIL/uL (ref 3.87–5.11)
WBC: 11.8 10*3/uL — ABNORMAL HIGH (ref 4.0–10.5)

## 2013-03-27 LAB — POCT URINALYSIS DIPSTICK
Bilirubin, UA: NEGATIVE
Glucose, UA: NEGATIVE
Spec Grav, UA: 1.01

## 2013-03-27 NOTE — Progress Notes (Signed)
  Subjective:    Patient ID: Julie Daniel, female    DOB: March 07, 1981, 32 y.o.   MRN: [id number]  HPI Patient is seen with vaginal spotting Back in September her period was irregular in terms of having 2 days of bleeding rather than her usual 4-5. After 2 weeks she had some further spotting. She is due for her next normal menstrual period on October 19 but the past 4 days has had some mild spotting as well. She only noticed some vaginal spotting when urinating. She felt this is coming from the vagina rather than her bladder. She's not had any heavy bleeding. No pelvic pain. No fevers or chills. No burning with urination. No history of kidney stones.  Patient had Pap smear 2012 normal. She's had history of frequent ovarian cysts in the past. She has had what sounds like hysterosalpingogram about 6 years ago and states that one fallopian tube was blocked and the other one was partially blocked. She and her boyfriend do not use any contraception at this time. She denies any nausea or vomiting. No stool changes. Mild pain left adnexal region. No vaginal discharge.  Past Medical History  Diagnosis Date  . Chicken pox   . GERD (gastroesophageal reflux disease)   . UTI (urinary tract infection)   . History of ovarian cyst      treated woth OCPS  . Hx SBO   . Infertility of tubal origin     hx of blocked tube    Past Surgical History  Procedure Laterality Date  . Nasal septum surgery  12/24/11    reports that she has been smoking Cigarettes.  She has a 5 pack-year smoking history. She has never used smokeless tobacco. She reports that she does not drink alcohol or use illicit drugs. family history includes Cancer in her maternal grandfather; Diabetes in her maternal grandmother; Hypertension in her mother; Sudden death (age of onset: 26) in her brother. No Known Allergies    Review of Systems  Constitutional: Negative for fever, chills and unexpected weight change.  Cardiovascular: Negative  for chest pain.  Gastrointestinal: Negative for nausea, vomiting, diarrhea and blood in stool.  Genitourinary: Positive for vaginal bleeding. Negative for dysuria, vaginal discharge and vaginal pain.  Neurological: Negative for dizziness and headaches.  Hematological: Negative for adenopathy.       Objective:   Physical Exam  Constitutional: She appears well-developed and well-nourished.  Cardiovascular: Normal rate and regular rhythm.   Pulmonary/Chest: Effort normal and breath sounds normal. No respiratory distress. She has no wheezes. She has no rales.  Abdominal: Soft. Bowel sounds are normal. She exhibits no distension and no mass.  Minimal tenderness left lower quadrant to deep palpation. No guarding or rebound  Genitourinary:  Normal external genitalia She has minimal dark blood coming from cervical os. Cervix normal in appearance. Bimanual exam reveals minimal left adnexal tenderness but no palpated mass. No cervical motion tenderness.          Assessment & Plan:  Irregular menses. She's had some irregular spotting over the past couple months. History of reported infertility of tubal origin. Pregnancy test was performed which is negative. Urine dipstick reveals only trace blood but no leukocytes or nitrites.  Pelvic exam confirms that she does have some very mild bleeding from cervical os. Cervix is normal in appearance. Check CBC and TSH. Consider pelvic ultrasound with her left adnexal pain.

## 2013-03-27 NOTE — Patient Instructions (Signed)
Avoid any aspirin products. Followup promptly for any heavy bleeding, fever, or increasing pelvic pain

## 2013-03-30 NOTE — Addendum Note (Signed)
Addended by: Kristian Covey on: 03/30/2013 12:40 PM   Modules accepted: Orders

## 2013-04-03 ENCOUNTER — Other Ambulatory Visit: Payer: BC Managed Care – PPO

## 2013-08-14 ENCOUNTER — Encounter (HOSPITAL_COMMUNITY): Payer: Self-pay | Admitting: Emergency Medicine

## 2013-08-14 ENCOUNTER — Emergency Department (HOSPITAL_COMMUNITY)
Admission: EM | Admit: 2013-08-14 | Discharge: 2013-08-14 | Disposition: A | Discharge status: Home / Self Care | Payer: BC Managed Care – PPO | Attending: Emergency Medicine | Admitting: Emergency Medicine

## 2013-08-14 DIAGNOSIS — H109 Unspecified conjunctivitis: Secondary | ICD-10-CM

## 2013-08-14 DIAGNOSIS — Z8742 Personal history of other diseases of the female genital tract: Secondary | ICD-10-CM | POA: Insufficient documentation

## 2013-08-14 DIAGNOSIS — Z8719 Personal history of other diseases of the digestive system: Secondary | ICD-10-CM | POA: Insufficient documentation

## 2013-08-14 DIAGNOSIS — Z8619 Personal history of other infectious and parasitic diseases: Secondary | ICD-10-CM | POA: Insufficient documentation

## 2013-08-14 DIAGNOSIS — F17200 Nicotine dependence, unspecified, uncomplicated: Secondary | ICD-10-CM | POA: Insufficient documentation

## 2013-08-14 DIAGNOSIS — H53149 Visual discomfort, unspecified: Secondary | ICD-10-CM | POA: Insufficient documentation

## 2013-08-14 DIAGNOSIS — Z7982 Long term (current) use of aspirin: Secondary | ICD-10-CM | POA: Insufficient documentation

## 2013-08-14 DIAGNOSIS — Z87440 Personal history of urinary (tract) infections: Secondary | ICD-10-CM | POA: Insufficient documentation

## 2013-08-14 MED ORDER — TOBRAMYCIN 0.3 % OP SOLN
2.0000 [drp] | Freq: Three times a day (TID) | OPHTHALMIC | Status: DC
  Administered 2013-08-14: 2 [drp] via OPHTHALMIC
  Filled 2013-08-14: qty 5

## 2013-08-14 MED ORDER — PROPARACAINE HCL 0.5 % OP SOLN: 1.0000 [drp] | Freq: Once | OPHTHALMIC | Status: DC

## 2013-08-14 MED ORDER — TETRACAINE HCL 0.5 % OP SOLN
1.0000 [drp] | Freq: Once | OPHTHALMIC | Status: AC
  Administered 2013-08-14: 1 [drp] via OPHTHALMIC
  Filled 2013-08-14: qty 2

## 2013-08-14 MED ORDER — FLUORESCEIN SODIUM 1 MG OP STRP
1.0000 | ORAL_STRIP | Freq: Once | OPHTHALMIC | Status: AC
  Administered 2013-08-14: 1 via OPHTHALMIC
  Filled 2013-08-14: qty 1

## 2013-08-14 NOTE — ED Notes (Signed)
Pt st's she woke up this am with pain and redness to right eye.  No known injury.  St's she has been using warm compresses.

## 2013-08-14 NOTE — ED Notes (Signed)
Presents with red and painful left eye began last night with irritation but got worse this AM. Contact wearer, has taken contacts out. Denies itchiness, denies drainage.  Light makes pain worse.

## 2013-08-14 NOTE — ED Provider Notes (Signed)
CSN: [id number]     Arrival date & time 08/14/13  1504 History  This chart was scribed for non-physician practitioner, Elpidio Anis, PA-C working with Gavin Pound. Oletta Lamas, MD by Luisa Dago, ED scribe. This patient was seen in room TR04C/TR04C and the patient's care was started at 5:20 PM.    Chief Complaint  Patient presents with  . Eye Pain    The history is provided by the patient. No language interpreter was used.   HPI Comments: Julie Daniel is a 33 y.o. female who presents to the Emergency Department complaining of gradual onset worsening eye pain that started this morning. Pt states that upon waking her eye lid was really swollen. Pt is also complaining of associated redness, photophobia, and clear drainage. She states she applied a warm compress to her eye with minimal relief. Pt does wear contacts daily. She is concerned that she may have pink eye. Pt reports putting eye drops on her right eye that was prescribed to her for a previous eye injury, with no relief. Denies any associated itchiness, fever, chills, or diaphoresis. Pt denies any other URI symptoms.   Past Medical History  Diagnosis Date  . Chicken pox   . GERD (gastroesophageal reflux disease)   . UTI (urinary tract infection)   . History of ovarian cyst      treated woth OCPS  . Hx SBO   . Infertility of tubal origin     hx of blocked tube    Past Surgical History  Procedure Laterality Date  . Nasal septum surgery  12/24/11   Family History  Problem Relation Age of Onset  . Hypertension Mother   . Sudden death Brother 35  . Diabetes Maternal Grandmother   . Cancer Maternal Grandfather     lung, throat   History  Substance Use Topics  . Smoking status: Current Every Day Smoker -- 0.50 packs/day for 10 years    Types: Cigarettes  . Smokeless tobacco: Never Used  . Alcohol Use: No   OB History   Grav Para Term Preterm Abortions TAB SAB Ect Mult Living                 Review of Systems   Constitutional: Negative for fever, chills and diaphoresis.  Eyes: Positive for photophobia, pain and discharge. Negative for itching.      Allergies  Review of patient's allergies indicates no known allergies.  Home Medications   Current Outpatient Rx  Name  Route  Sig  Dispense  Refill  . aspirin EC 81 MG tablet   Oral   Take 243 mg by mouth daily as needed for mild pain.          BP 125/91  Pulse 63  Temp(Src) 98.2 F (36.8 C) (Oral)  Resp 18  SpO2 100%  Physical Exam  Nursing note and vitals reviewed. Constitutional: She is oriented to person, place, and time. She appears well-developed and well-nourished.  HENT:  Head: Normocephalic and atraumatic.  Right eye is erythematous. Right Upper lids slightly swollen. Sty located on right lateral anterior lid.   Eyes: EOM are normal. Pupils are equal, round, and reactive to light. Right eye exhibits hordeolum. Left eye exhibits no discharge and no hordeolum. Right conjunctiva is injected. Left conjunctiva is not injected.  Right conjunctival redness and injection. No purulent discharge present. Full, pain-free range of motion of right EOM. Fluorescein stain negative for abrasion.  Neck: Normal range of motion.  Cardiovascular: Normal rate.  Pulmonary/Chest: Effort normal.  Neurological: She is alert and oriented to person, place, and time.  Skin: Skin is warm and dry.  Psychiatric: She has a normal mood and affect.    ED Course  Procedures (including critical care time)  DIAGNOSTIC STUDIES: Oxygen Saturation is 100% on RA, normal by my interpretation.    COORDINATION OF CARE: 5:27 PM- Pt advised of plan for treatment and pt agrees.  Labs Review Labs Reviewed - No data to display Imaging Review No results found.  EKG Interpretation  None  MDM   Final diagnoses:  None    1. conjunctivitis, right  Tobrex given, follow up for recheck on Monday (in 2 days). Strict return precautions given.   I  personally performed the services described in this documentation, which was scribed in my presence. The recorded information has been reviewed and is accurate.     Arnoldo HookerShari A Quinnley Colasurdo, PA-C 08/14/13 1847

## 2013-08-14 NOTE — ED Notes (Signed)
OD 20/200  OS 20/30  OU 20/40   Pt wear contacts but does not have in.

## 2013-08-14 NOTE — Discharge Instructions (Signed)

## 2013-08-15 NOTE — ED Provider Notes (Signed)
Medical screening examination/treatment/procedure(s) were performed by non-physician practitioner and as supervising physician I was immediately available for consultation/collaboration.   EKG Interpretation None        Gavin PoundMichael Y. Oletta LamasGhim, MD 08/15/13 [phone]

## 2013-08-18 ENCOUNTER — Emergency Department (HOSPITAL_COMMUNITY)
Admission: EM | Admit: 2013-08-18 | Discharge: 2013-08-18 | Disposition: A | Discharge status: Home / Self Care | Payer: BC Managed Care – PPO | Attending: Emergency Medicine | Admitting: Emergency Medicine

## 2013-08-18 ENCOUNTER — Encounter (HOSPITAL_COMMUNITY): Payer: Self-pay | Admitting: Emergency Medicine

## 2013-08-18 DIAGNOSIS — Z792 Long term (current) use of antibiotics: Secondary | ICD-10-CM | POA: Insufficient documentation

## 2013-08-18 DIAGNOSIS — Z8619 Personal history of other infectious and parasitic diseases: Secondary | ICD-10-CM | POA: Insufficient documentation

## 2013-08-18 DIAGNOSIS — Z8719 Personal history of other diseases of the digestive system: Secondary | ICD-10-CM | POA: Insufficient documentation

## 2013-08-18 DIAGNOSIS — Z8742 Personal history of other diseases of the female genital tract: Secondary | ICD-10-CM | POA: Insufficient documentation

## 2013-08-18 DIAGNOSIS — Z87440 Personal history of urinary (tract) infections: Secondary | ICD-10-CM | POA: Insufficient documentation

## 2013-08-18 DIAGNOSIS — Y939 Activity, unspecified: Secondary | ICD-10-CM | POA: Insufficient documentation

## 2013-08-18 DIAGNOSIS — F17200 Nicotine dependence, unspecified, uncomplicated: Secondary | ICD-10-CM | POA: Insufficient documentation

## 2013-08-18 DIAGNOSIS — S058X9A Other injuries of unspecified eye and orbit, initial encounter: Secondary | ICD-10-CM | POA: Insufficient documentation

## 2013-08-18 DIAGNOSIS — R000 Tachycardia, unspecified: Secondary | ICD-10-CM | POA: Insufficient documentation

## 2013-08-18 DIAGNOSIS — H109 Unspecified conjunctivitis: Secondary | ICD-10-CM

## 2013-08-18 DIAGNOSIS — S0502XA Injury of conjunctiva and corneal abrasion without foreign body, left eye, initial encounter: Secondary | ICD-10-CM

## 2013-08-18 DIAGNOSIS — X58XXXA Exposure to other specified factors, initial encounter: Secondary | ICD-10-CM | POA: Insufficient documentation

## 2013-08-18 DIAGNOSIS — Y929 Unspecified place or not applicable: Secondary | ICD-10-CM | POA: Insufficient documentation

## 2013-08-18 MED ORDER — TETRACAINE HCL 0.5 % OP SOLN
2.0000 [drp] | Freq: Once | OPHTHALMIC | Status: AC
  Administered 2013-08-18: 2 [drp] via OPHTHALMIC
  Filled 2013-08-18: qty 2

## 2013-08-18 MED ORDER — FLUORESCEIN SODIUM 1 MG OP STRP
1.0000 | ORAL_STRIP | Freq: Once | OPHTHALMIC | Status: AC
  Administered 2013-08-18: 1 via OPHTHALMIC
  Filled 2013-08-18: qty 1

## 2013-08-18 MED ORDER — CIPROFLOXACIN HCL 0.3 % OP SOLN: 1.0000 [drp] | OPHTHALMIC | Status: DC

## 2013-08-18 NOTE — ED Provider Notes (Signed)
CSN: [id number]     Arrival date & time 08/18/13  1954 History  This chart was scribed for non-physician practitioner Lowella Dell, PA-C working with Layla Maw Ward, DO by Danella Maiers, ED Scribe. This patient was seen in room TR04C/TR04C and the patient's care was started at 8:35 PM.  Chief Complaint  Patient presents with  . Eye Pain   The history is provided by the patient. No language interpreter was used.   HPI Comments: Julie Daniel is a 33 y.o. female who presents to the Emergency Department complaining of swelling and redness to her left eye. She was treated for pink eye in the right eye 2 days ago with tobramycin, the right eye got better but now the left eye has started with the same symptoms.  She reports foreign body sensation in the left eye. She states she wears contacts and sleeps in them.  She reports a bump to the right of her left eye for the past year. She states the bump is painful but denies eye pain. She states she has been using warm compresses which may have aggravated the area. She denies fever/chills, nausea, vomiting.   Past Medical History  Diagnosis Date  . Chicken pox   . GERD (gastroesophageal reflux disease)   . UTI (urinary tract infection)   . History of ovarian cyst      treated woth OCPS  . Hx SBO   . Infertility of tubal origin     hx of blocked tube    Past Surgical History  Procedure Laterality Date  . Nasal septum surgery  12/24/11   Family History  Problem Relation Age of Onset  . Hypertension Mother   . Sudden death Brother 38  . Diabetes Maternal Grandmother   . Cancer Maternal Grandfather     lung, throat   History  Substance Use Topics  . Smoking status: Current Every Day Smoker -- 0.50 packs/day for 10 years    Types: Cigarettes  . Smokeless tobacco: Never Used  . Alcohol Use: No   OB History   Grav Para Term Preterm Abortions TAB SAB Ect Mult Living                 Review of Systems  All other systems reviewed and are  negative.      Allergies  Review of patient's allergies indicates no known allergies.  Home Medications   Current Outpatient Rx  Name  Route  Sig  Dispense  Refill  . tobramycin (TOBREX) 0.3 % ophthalmic solution   Right Eye   Place 2 drops into the right eye 3 (three) times daily.         . ciprofloxacin (CILOXAN) 0.3 % ophthalmic solution   Left Eye   Place 1 drop into the left eye every 4 (four) hours. Place one drop in effected eye every 4 hours until follow up with opthalmologist   2.5 mL   1    BP 122/82  Pulse 117  Temp(Src) 98.5 F (36.9 C) (Oral)  Resp 18  Ht 5\' 2"  (1.575 m)  Wt 148 lb (67.132 kg)  BMI 27.06 kg/m2  SpO2 99%  LMP 08/01/2013 Physical Exam  Nursing note and vitals reviewed. Constitutional: She is oriented to person, place, and time. She appears well-developed and well-nourished. No distress.  HENT:  Head: Normocephalic and atraumatic.  Eyes: EOM are normal. Pupils are equal, round, and reactive to light. Right eye exhibits no chemosis, no discharge, no exudate and  no hordeolum. No foreign body present in the right eye. Left eye exhibits no chemosis, no discharge, no exudate and no hordeolum. No foreign body present in the left eye. Right conjunctiva is not injected. Right conjunctiva has no hemorrhage. Left conjunctiva is injected. Left conjunctiva has no hemorrhage. No scleral icterus.  Slit lamp exam:      Left eye corneal abrasion: left lateral inferior cornea.    Corneal abrasion of Left lateral inferior cornea.  Lids inverted, without any evidence of foreign body.   Small dime-sized erythematous swelling at superior medial aspect of left eye at nasal bridge as depicted in diagram above.   Neck: Normal range of motion. Neck supple. No JVD present. No tracheal deviation present.  Cardiovascular: Normal rate and regular rhythm.  Exam reveals no gallop and no friction rub.   No murmur heard. Pulmonary/Chest: Effort normal. No respiratory  distress. She has no wheezes. She has no rhonchi. She has no rales.  Musculoskeletal: Normal range of motion. She exhibits no edema.  Neurological: She is alert and oriented to person, place, and time.  Skin: Skin is warm and dry. She is not diaphoretic.  Psychiatric: She has a normal mood and affect. Her behavior is normal.    ED Course  Procedures (including critical care time) Medications  tetracaine (PONTOCAINE) 0.5 % ophthalmic solution 2 drop (2 drops Right Eye Given 08/18/13 2139)  fluorescein ophthalmic strip 1 strip (1 strip Both Eyes Given 08/18/13 2139)    DIAGNOSTIC STUDIES: Oxygen Saturation is 99% on RA, normal by my interpretation.    COORDINATION OF CARE: 9:34 PM- Discussed treatment plan with pt. Pt agrees to plan.    Labs Review Labs Reviewed - No data to display Imaging Review No results found.   EKG Interpretation None      MDM   Final diagnoses:  Corneal abrasion, left  Conjunctivitis of left eye   Patient tachycardic on admission. HR normalized throughout stay in ED.  Patient afebrile Swelling above left eye at nasal bridge appears consistent with small abscess. Informed patient she needs specialty follow up with Optho for further management of the abscess like mass.  Do not wear contacts in LEFT eye until you can be seen by Opthalmologist. Follow up with Optho in 24 hours. Take cipro drops as directed. Return to ED should you develop any visual changes or worsening pain.   Meds given in ED:  Medications  tetracaine (PONTOCAINE) 0.5 % ophthalmic solution 2 drop (2 drops Right Eye Given 08/18/13 2139)  fluorescein ophthalmic strip 1 strip (1 strip Both Eyes Given 08/18/13 2139)    New Prescriptions   CIPROFLOXACIN (CILOXAN) 0.3 % OPHTHALMIC SOLUTION    Place 1 drop into the left eye every 4 (four) hours. Place one drop in effected eye every 4 hours until follow up with opthalmologist    I personally performed the services described in this  documentation, which was scribed in my presence. The recorded information has been reviewed and is accurate.   Allen NorrisJacob Gray WillisLackey, PA-C 08/19/13 [phone]

## 2013-08-18 NOTE — Discharge Instructions (Signed)
Follow up with Opthalmology within 24 hours. Use Ciprofloxacin drops as directed. Stop tobramycin drops. Return to ED if you develop any visual changes or worsening pain. DO NOT WEAR CONTACTS in LEFT EYE until you are cleared by the Opthalmologist.   Corneal Abrasion The cornea is the clear covering at the front and center of the eye. When looking at the colored portion of the eye (iris), you are looking through the cornea. This very thin tissue is made up of many layers. The surface layer is a single layer of cells (corneal epithelium) and is one of the most sensitive tissues in the body. If a scratch or injury causes the corneal epithelium to come off, it is called a corneal abrasion. If the injury extends to the tissues below the epithelium, the condition is called a corneal ulcer. CAUSES   Scratches.  Trauma.  Foreign body in the eye. Some people have recurrences of abrasions in the area of the original injury even after it has healed (recurrent erosion syndrome). Recurrent erosion syndrome generally improves and goes away with time. SYMPTOMS   Eye pain.  Difficulty or inability to keep the injured eye open.  The eye becomes very sensitive to light.  Recurrent erosions tend to happen suddenly, first thing in the morning, usually after waking up and opening the eye. DIAGNOSIS  Your health care provider can diagnose a corneal abrasion during an eye exam. Dye is usually placed in the eye using a drop or a small paper strip moistened by your tears. When the eye is examined with a special light, the abrasion shows up clearly because of the dye. TREATMENT   Small abrasions may be treated with antibiotic drops or ointment alone.  Usually a pressure patch is specially applied. Pressure patches prevent the eye from blinking, allowing the corneal epithelium to heal. A pressure patch also reduces the amount of pain present in the eye during healing. Most corneal abrasions heal within 2 3 days  with no effect on vision. If the abrasion becomes infected and spreads to the deeper tissues of the cornea, a corneal ulcer can result. This is serious because it can cause corneal scarring. Corneal scars interfere with light passing through the cornea and cause a loss of vision in the involved eye. HOME CARE INSTRUCTIONS  Use medicine or ointment as directed. Only take over-the-counter or prescription medicines for pain, discomfort, or fever as directed by your health care provider.  Do not drive or operate machinery while your eye is patched. Your ability to judge distances is impaired.  If your health care provider has given you a follow-up appointment, it is very important to keep that appointment. Not keeping the appointment could result in a severe eye infection or permanent loss of vision. If there is any problem keeping the appointment, let your health care provider know. SEEK MEDICAL CARE IF:   You have pain, light sensitivity, and a scratchy feeling in one eye or both eyes.  Your pressure patch keeps loosening up, and you can blink your eye under the patch after treatment.  Any kind of discharge develops from the eye after treatment or if the lids stick together in the morning.  You have the same symptoms in the morning as you did with the original abrasion days, weeks, or months after the abrasion healed. MAKE SURE YOU:   Understand these instructions.  Will watch your condition.  Will get help right away if you are not doing well or get worse. Document  Released: 06/01/2000 Document Revised: 03/25/2013 Document Reviewed: 02/09/2013 [facility] Patient Information 2014 [employer] [place].

## 2013-08-18 NOTE — ED Notes (Signed)
Patient presents with swelling to the corner of her left eye.  Was being treated for pink eye and used ice and warm compresses and this area began to swell.  Has had same in the past

## 2013-08-20 NOTE — ED Provider Notes (Signed)
Medical screening examination/treatment/procedure(s) were performed by non-physician practitioner and as supervising physician I was immediately available for consultation/collaboration.   EKG Interpretation None        Layla MawKristen N Ward, DO 08/20/13 2344

## 2015-02-02 ENCOUNTER — Emergency Department (HOSPITAL_COMMUNITY)
Admission: EM | Admit: 2015-02-02 | Discharge: 2015-02-03 | Disposition: A | Discharge status: Home / Self Care | Payer: No Typology Code available for payment source | Attending: Emergency Medicine | Admitting: Emergency Medicine

## 2015-02-02 ENCOUNTER — Encounter (HOSPITAL_COMMUNITY): Payer: Self-pay | Admitting: Emergency Medicine

## 2015-02-02 DIAGNOSIS — Y92410 Unspecified street and highway as the place of occurrence of the external cause: Secondary | ICD-10-CM | POA: Diagnosis not present

## 2015-02-02 DIAGNOSIS — Z8742 Personal history of other diseases of the female genital tract: Secondary | ICD-10-CM | POA: Diagnosis not present

## 2015-02-02 DIAGNOSIS — Z792 Long term (current) use of antibiotics: Secondary | ICD-10-CM | POA: Insufficient documentation

## 2015-02-02 DIAGNOSIS — S161XXA Strain of muscle, fascia and tendon at neck level, initial encounter: Secondary | ICD-10-CM | POA: Insufficient documentation

## 2015-02-02 DIAGNOSIS — S0990XA Unspecified injury of head, initial encounter: Secondary | ICD-10-CM | POA: Insufficient documentation

## 2015-02-02 DIAGNOSIS — Z8619 Personal history of other infectious and parasitic diseases: Secondary | ICD-10-CM | POA: Insufficient documentation

## 2015-02-02 DIAGNOSIS — Y998 Other external cause status: Secondary | ICD-10-CM | POA: Diagnosis not present

## 2015-02-02 DIAGNOSIS — S199XXA Unspecified injury of neck, initial encounter: Secondary | ICD-10-CM | POA: Diagnosis present

## 2015-02-02 DIAGNOSIS — Z8719 Personal history of other diseases of the digestive system: Secondary | ICD-10-CM | POA: Diagnosis not present

## 2015-02-02 DIAGNOSIS — Y9389 Activity, other specified: Secondary | ICD-10-CM | POA: Diagnosis not present

## 2015-02-02 DIAGNOSIS — Z87440 Personal history of urinary (tract) infections: Secondary | ICD-10-CM | POA: Insufficient documentation

## 2015-02-02 DIAGNOSIS — S39012A Strain of muscle, fascia and tendon of lower back, initial encounter: Secondary | ICD-10-CM | POA: Insufficient documentation

## 2015-02-02 DIAGNOSIS — Z720 Tobacco use: Secondary | ICD-10-CM | POA: Insufficient documentation
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
## 2015-02-03 ENCOUNTER — Emergency Department (HOSPITAL_COMMUNITY): Payer: No Typology Code available for payment source

## 2015-02-03 MED ORDER — KETOROLAC TROMETHAMINE 30 MG/ML IJ SOLN
30.0000 mg | Freq: Once | INTRAMUSCULAR | Status: AC
  Administered 2015-02-03: 30 mg via INTRAMUSCULAR
  Filled 2015-02-03: qty 1

## 2015-02-03 MED ORDER — HYDROCODONE-ACETAMINOPHEN 5-325 MG PO TABS: 1.0000 | ORAL_TABLET | Freq: Four times a day (QID) | ORAL | Status: DC | PRN

## 2015-02-03 MED ORDER — CYCLOBENZAPRINE HCL 5 MG PO TABS: 5.0000 mg | ORAL_TABLET | Freq: Three times a day (TID) | ORAL | Status: DC

## 2015-02-03 MED ORDER — CYCLOBENZAPRINE HCL 10 MG PO TABS
5.0000 mg | ORAL_TABLET | Freq: Once | ORAL | Status: AC
  Administered 2015-02-03: 5 mg via ORAL
  Filled 2015-02-03: qty 1

## 2015-02-03 MED ORDER — HYDROCODONE-ACETAMINOPHEN 5-325 MG PO TABS
1.0000 | ORAL_TABLET | Freq: Once | ORAL | Status: AC
  Administered 2015-02-03: 1 via ORAL
  Filled 2015-02-03: qty 1

## 2015-02-03 NOTE — Discharge Instructions (Signed)
Your x-rays and CT scan are all normal you be given prescription for pain control as well as a muscle relaxer.  You've also been given instructions on certain exercises that you can do to help your recovery.  Please follow-up with your primary care physician as needed
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
## 2015-02-03 NOTE — ED Notes (Signed)
Pt alert,oriented, and ambulatory upon DC. She was advised to follow up with PCP if not better, take prescribed meds for pain, and start exercises for neck and back. She verbalizes understanding.

## 2015-07-20 ENCOUNTER — Other Ambulatory Visit (HOSPITAL_COMMUNITY)
Admission: RE | Admit: 2015-07-20 | Discharge: 2015-07-20 | Disposition: A | Discharge status: Home / Self Care | Payer: BLUE CROSS/BLUE SHIELD | Source: Ambulatory Visit | Attending: Family Medicine | Admitting: Family Medicine

## 2015-07-20 ENCOUNTER — Ambulatory Visit (INDEPENDENT_AMBULATORY_CARE_PROVIDER_SITE_OTHER): Payer: BLUE CROSS/BLUE SHIELD | Admitting: Family Medicine

## 2015-07-20 VITALS — BP 122/80 | HR 63 | Temp 97.5°F | Ht 62.0 in | Wt 154.3 lb

## 2015-07-20 DIAGNOSIS — L729 Follicular cyst of the skin and subcutaneous tissue, unspecified: Secondary | ICD-10-CM

## 2015-07-20 DIAGNOSIS — Z01419 Encounter for gynecological examination (general) (routine) without abnormal findings: Secondary | ICD-10-CM | POA: Diagnosis present

## 2015-07-20 DIAGNOSIS — Z0000 Encounter for general adult medical examination without abnormal findings: Secondary | ICD-10-CM | POA: Diagnosis not present

## 2015-07-20 LAB — HEPATIC FUNCTION PANEL
ALBUMIN: 4.3 g/dL (ref 3.5–5.2)
ALT: 6 U/L (ref 0–35)
AST: 12 U/L (ref 0–37)
Alkaline Phosphatase: 65 U/L (ref 39–117)
Bilirubin, Direct: 0.1 mg/dL (ref 0.0–0.3)
Total Bilirubin: 0.3 mg/dL (ref 0.2–1.2)
Total Protein: 7.7 g/dL (ref 6.0–8.3)

## 2015-07-20 LAB — BASIC METABOLIC PANEL
BUN: 9 mg/dL (ref 6–23)
CALCIUM: 9.1 mg/dL (ref 8.4–10.5)
CO2: 28 mEq/L (ref 19–32)
Chloride: 101 mEq/L (ref 96–112)
Creatinine, Ser: 0.67 mg/dL (ref 0.40–1.20)
GFR: 107.82 mL/min (ref >60.00)
Glucose, Bld: 93 mg/dL (ref 70–99)
Potassium: 3.5 mEq/L (ref 3.5–5.1)
SODIUM: 138 meq/L (ref 135–145)

## 2015-07-20 LAB — CBC WITH DIFFERENTIAL/PLATELET
BASOS PCT: 0.4 % (ref 0.0–3.0)
Basophils Absolute: 0 10*3/uL (ref 0.0–0.1)
Eosinophils Absolute: 0.1 10*3/uL (ref 0.0–0.7)
Eosinophils Relative: 1.2 % (ref 0.0–5.0)
HCT: 38 % (ref 36.0–46.0)
Hemoglobin: 12.5 g/dL (ref 12.0–15.0)
Lymphocytes Relative: 37 % (ref 12.0–46.0)
Lymphs Abs: 3.1 10*3/uL (ref 0.7–4.0)
MCHC: 32.9 g/dL (ref 30.0–36.0)
MCV: 96.4 fl (ref 78.0–100.0)
MONOS PCT: 3.2 % (ref 3.0–12.0)
Monocytes Absolute: 0.3 10*3/uL (ref 0.1–1.0)
Neutro Abs: 4.9 10*3/uL (ref 1.4–7.7)
Neutrophils Relative %: 58.2 % (ref 43.0–77.0)
PLATELETS: 257 10*3/uL (ref 150.0–400.0)
RBC: 3.94 Mil/uL (ref 3.87–5.11)
RDW: 13 % (ref 11.5–15.5)
WBC: 8.4 10*3/uL (ref 4.0–10.5)

## 2015-07-20 LAB — LIPID PANEL
Cholesterol: 107 mg/dL (ref 0–200)
HDL: 33.4 mg/dL — ABNORMAL LOW (ref >39.00)
LDL Cholesterol: 58 mg/dL (ref 0–99)
NonHDL: 73.82
TRIGLYCERIDES: 79 mg/dL (ref 0.0–149.0)
Total CHOL/HDL Ratio: 3
VLDL: 15.8 mg/dL (ref 0.0–40.0)

## 2015-07-20 LAB — TSH: TSH: 4.1 u[IU]/mL (ref 0.35–4.50)

## 2015-07-20 MED ORDER — CEPHALEXIN 500 MG PO CAPS: 500.0000 mg | ORAL_CAPSULE | Freq: Three times a day (TID) | ORAL | Status: DC

## 2015-07-20 NOTE — Progress Notes (Signed)
Subjective:    Patient ID: Julie Daniel, female    DOB: February 15, 1981, 35 y.o.   MRN: [id number]  HPI Patient is here for physical exam. She's not had a Pap smear in a couple years. She is unfortunately still smoking half pack cigarettes per day. She is not taking any chronic medications. She's had some recent gradual weight gain. She states that she drinks up to [employer] per day She realizes this is probably contributing a lot to her weight gain. She's had a couple cystic lesions left medial canthus of eye and also up with the bridge of the nose. These are nonpainful and nondraining.  Past Medical History  Diagnosis Date  . Chicken pox   . GERD (gastroesophageal reflux disease)   . UTI (urinary tract infection)   . History of ovarian cyst      treated woth OCPS  . Hx SBO   . Infertility of tubal origin     hx of blocked tube    Past Surgical History  Procedure Laterality Date  . Nasal septum surgery  12/24/11    reports that she has been smoking Cigarettes.  She has a 5 pack-year smoking history. She has never used smokeless tobacco. She reports that she does not drink alcohol or use illicit drugs. family history includes Cancer in her maternal grandfather; Diabetes in her maternal grandmother; Hypertension in her mother; Sudden death (age of onset: 29) in her brother. No Known Allergies    Review of Systems  Constitutional: Negative for fever, activity change, appetite change, fatigue and unexpected weight change.  HENT: Negative for hearing loss, sore throat and trouble swallowing.   Respiratory: Negative for cough, shortness of breath and wheezing.   Cardiovascular: Negative for chest pain, palpitations and leg swelling.  Gastrointestinal: Negative for nausea, vomiting, abdominal pain, blood in stool and abdominal distention.  Genitourinary: Negative for dysuria and hematuria.  Musculoskeletal: Negative for myalgias and gait problem.  Skin: Negative for rash.    Neurological: Negative for dizziness, syncope, weakness and headaches.  Hematological: Negative for adenopathy.  Psychiatric/Behavioral: Negative for confusion and dysphoric mood.       Objective:   Physical Exam  Constitutional: She is oriented to person, place, and time. She appears well-developed and well-nourished.  HENT:  Head: Normocephalic and atraumatic.  Eyes: EOM are normal. Pupils are equal, round, and reactive to light.  Neck: Normal range of motion. Neck supple. No thyromegaly present.  Cardiovascular: Normal rate, regular rhythm and normal heart sounds.   No murmur heard. Pulmonary/Chest: Breath sounds normal. No respiratory distress. She has no wheezes. She has no rales.  Breasts symmetric with no masses.  Abdominal: Soft. Bowel sounds are normal. She exhibits no distension and no mass. There is no tenderness. There is no rebound and no guarding.  Genitourinary: Vagina normal and uterus normal.  Minimal whitish colored vaginal discharge.  Cervix appears normal.  Musculoskeletal: Normal range of motion. She exhibits no edema.  Lymphadenopathy:    She has no cervical adenopathy.  Neurological: She is alert and oriented to person, place, and time. She displays normal reflexes. No cranial nerve deficit.  Skin: No rash noted.  Approx. 1cm mobile cystic lesion left bridge of nose.  Psychiatric: She has a normal mood and affect. Her behavior is normal. Judgment and thought content normal.          Assessment & Plan:  Physical exam. Screening labs obtained. We strongly advocated that she scale back her sugar  intake and sodium intake. She is encouraged to stop smoking. Establish more consistent exercise.

## 2015-07-20 NOTE — Patient Instructions (Signed)
Scale back soda and sugar intake Try to quit smoking. We will call you regarding lab work done today

## 2015-07-20 NOTE — Progress Notes (Signed)
Pre visit review using our clinic review tool, if applicable. No additional management support is needed unless otherwise documented below in the visit note. 

## 2015-07-22 LAB — CYTOLOGY - PAP

## 2015-07-28 ENCOUNTER — Telehealth: Payer: Self-pay | Admitting: Family Medicine

## 2015-07-28 ENCOUNTER — Ambulatory Visit (INDEPENDENT_AMBULATORY_CARE_PROVIDER_SITE_OTHER): Payer: BLUE CROSS/BLUE SHIELD | Admitting: Family Medicine

## 2015-07-28 VITALS — BP 110/88 | HR 93 | Temp 98.6°F | Ht 62.0 in | Wt 155.7 lb

## 2015-07-28 DIAGNOSIS — M545 Low back pain, unspecified: Secondary | ICD-10-CM

## 2015-07-28 LAB — POCT URINALYSIS DIPSTICK
BILIRUBIN UA: NEGATIVE
Glucose, UA: NEGATIVE
Ketones, UA: NEGATIVE
LEUKOCYTES UA: NEGATIVE
Nitrite, UA: NEGATIVE
PH UA: 6.5
Protein, UA: NEGATIVE
Spec Grav, UA: 1.01
Urobilinogen, UA: 0.2

## 2015-07-28 MED ORDER — NAPROXEN 500 MG PO TABS: 500.0000 mg | ORAL_TABLET | Freq: Two times a day (BID) | ORAL | Status: DC

## 2015-07-28 NOTE — Progress Notes (Signed)
   Subjective:    Patient ID: Julie Daniel, female    DOB: 06/02/1981, 35 y.o.   MRN: [id number]  HPI Acute visit for left lower back pain without sciatica Denies injury. Onset yesterday. Location is left lower lumbar. No radiation She describes achy pain sometimes sharp and worse with bending She has not had any burning with urination or urine frequency No fever or chills. No prior history of back difficulties Denies any lower extremity numbness or weakness. Back pain is worse with flexion and change of position. Slightly improved with heat  Past Medical History  Diagnosis Date  . Chicken pox   . GERD (gastroesophageal reflux disease)   . UTI (urinary tract infection)   . History of ovarian cyst      treated woth OCPS  . Hx SBO   . Infertility of tubal origin     hx of blocked tube    Past Surgical History  Procedure Laterality Date  . Nasal septum surgery  12/24/11    reports that she has been smoking Cigarettes.  She has a 5 pack-year smoking history. She has never used smokeless tobacco. She reports that she does not drink alcohol or use illicit drugs. family history includes Cancer in her maternal grandfather; Diabetes in her maternal grandmother; Hypertension in her mother; Sudden death (age of onset: 97) in her brother. No Known Allergies    Review of Systems  Constitutional: Negative for fever, chills, appetite change and unexpected weight change.  Respiratory: Negative for shortness of breath.   Genitourinary: Negative for dysuria and hematuria.  Musculoskeletal: Positive for back pain.  Neurological: Negative for weakness and numbness.       Objective:   Physical Exam  Constitutional: She appears well-developed and well-nourished.  Cardiovascular: Normal rate and regular rhythm.   Pulmonary/Chest: Effort normal and breath sounds normal. No respiratory distress. She has no wheezes. She has no rales.  Musculoskeletal:  Straight leg raises are  negative Diffuse tenderness left lower lumbar region. No spinal tenderness  Neurological:  Full-strength lower extremities. 2+ reflexes knee and ankle bilaterally. Normal sensory function throughout          Assessment & Plan:  Left lumbar back pain without sciatica. Urine dipstick unremarkable. Suspect muscular. Reviewed stretches. Try heat or ice for symptom relief. Naproxen 500 mg twice a day with food as needed. Reviewed proper lifting. Touch base and 2-3 weeks if not improving

## 2015-07-28 NOTE — Patient Instructions (Signed)

## 2015-07-28 NOTE — Progress Notes (Signed)
Pre visit review using our clinic review tool, if applicable. No additional management support is needed unless otherwise documented below in the visit note. 

## 2015-07-28 NOTE — Telephone Encounter (Signed)
Patient's recent labs was mailed to her but she does not know how to read it.  It's just a lot of numbers to her.  She would like for someone to call her and go over her labs. Please call [phone] to 3pm.

## 2015-07-29 NOTE — Telephone Encounter (Signed)
Pt was seen yesterday.

## 2018-03-19 ENCOUNTER — Ambulatory Visit: Payer: Self-pay | Admitting: Family Medicine

## 2018-08-25 ENCOUNTER — Other Ambulatory Visit: Payer: Self-pay

## 2018-08-25 ENCOUNTER — Ambulatory Visit (INDEPENDENT_AMBULATORY_CARE_PROVIDER_SITE_OTHER): Payer: BLUE CROSS/BLUE SHIELD | Admitting: Family Medicine

## 2018-08-25 ENCOUNTER — Encounter: Payer: Self-pay | Admitting: Family Medicine

## 2018-08-25 VITALS — BP 110/70 | HR 90 | Temp 98.0°F | Ht 61.5 in | Wt 172.5 lb

## 2018-08-25 DIAGNOSIS — R202 Paresthesia of skin: Secondary | ICD-10-CM | POA: Diagnosis not present

## 2018-08-25 NOTE — Patient Instructions (Signed)
Paresthesia  Paresthesia is an abnormal burning or prickling sensation. It is usually felt in the hands, arms, legs, or feet. However, it may occur in any part of the body. Usually, paresthesia is not painful. It may feel like:  · Tingling or numbness.  · Pins and needles.  · Skin crawling.  · Buzzing.  · Arms or legs falling asleep.  · Itching.  Paresthesia may occur without any clear cause, or it may be caused by:  · Breathing too quickly (hyperventilation).  · Pressure on a nerve.  · An underlying medical condition.  · Side effects of a medication.  · Nutritional deficiencies.  · Exposure to toxic chemicals.  Most people experience temporary (transient) paresthesia at some time in their lives. For some people, it may be long-lasting (chronic) because of an underlying medical condition. If you have paresthesia that lasts a long time, you may need to be evaluated by your health care provider.  Follow these instructions at home:  Alcohol use    · Do not drink alcohol if:  ? Your health care provider tells you not to drink.  ? You are pregnant, may be pregnant, or are planning to become pregnant.  · If you drink alcohol, limit how much you have:  ? 0-1 drink a day for women.  ? 0-2 drinks a day for men.  · Be aware of how much alcohol is in your drink. In the [place] one drink equals one typical bottle of beer (12 oz), one-half glass of wine (5 oz), or one shot of hard liquor (1½ oz).  Nutrition  · Eat a healthy diet. This includes:  ? Eating foods that are high in fiber, such as fresh fruits and vegetables, whole grains, and beans.  ? Limiting foods that are high in fat and processed sugars, such as fried or sweet foods.  General instructions  · Take over-the-counter and prescription medicines only as told by your health care provider.  · Do not use any products that contain nicotine or tobacco, such as cigarettes and e-cigarettes. These can keep blood from reaching damaged nerves. If you need help quitting, ask your  health care provider.  · If you have diabetes, work closely with your health care provider to keep your blood sugar under control.  · If you have numbness in your feet:  ? Check every day for signs of injury or infection. Watch for redness, warmth, and swelling.  ? Wear padded socks and comfortable shoes. These help protect your feet.  · Keep all follow-up visits as told by your health care provider. This is important.  Contact a health care provider if you:  · Have paresthesia that gets worse or does not go away.  · Have a burning or prickling feeling that gets worse when you walk.  · Have pain, cramps, or dizziness.  · Develop a rash.  Get help right away if you:  · Feel weak.  · Have trouble walking or moving.  · Have problems with speech, understanding, or vision.  · Feel confused.  · Cannot control your bladder or bowel movements.  · Have numbness after an injury.  · Develop new weakness in an arm or leg.  · Faint.  Summary  · Paresthesia is an abnormal burning or prickling sensation that is usually felt in the hands, arms, legs, or feet. It may also occur in other parts of the body.  · Paresthesia may occur without any clear cause, or it may be   caused by breathing too quickly (hyperventilation), pressure on a nerve, an underlying medical condition, side effects of a medication, nutritional deficiencies, or exposure to toxic chemicals.  · If you have paresthesia that lasts a long time, you may need to be evaluated by your health care provider.  This information is not intended to replace advice given to you by your health care provider. Make sure you discuss any questions you have with your health care provider.  Document Released: 05/25/2002 Document Revised: 06/13/2017 Document Reviewed: 06/13/2017  Elsevier Interactive Patient Education © 2019 [employer]

## 2018-08-25 NOTE — Progress Notes (Signed)
Subjective:     Patient ID: Julie Daniel, female   DOB: [birth year], 38 y.o.   MRN: [id number]  HPI Patient is seen with several week history of bilateral paresthesias involving hands and feet.  She is not aware of any obvious weakness though she thinks she may have had little weakness with grip on a couple of occasions.  She takes no medications.  No headaches.  No visual changes.  No dizziness.  No alcohol use.  No history of diabetes.  She not had any recent lab work.  No known family history of neuropathy.  She states she is also had frequent "nightmares "over the past week.  No new medication.  No illicit drug use.  Denies any fever, chills, night sweats, adenopathy, skin rash, arthralgias, abdominal pain, headaches.  Past Medical History:  Diagnosis Date  . Chicken pox   . GERD (gastroesophageal reflux disease)   . History of ovarian cyst     treated woth OCPS  . Hx SBO   . Infertility of tubal origin    hx of blocked tube   . UTI (urinary tract infection)    Past Surgical History:  Procedure Laterality Date  . NASAL SEPTUM SURGERY  12/24/11    reports that she has been smoking cigarettes. She has a 5.00 pack-year smoking history. She has never used smokeless tobacco. She reports previous drug use. Drug: Marijuana. She reports that she does not drink alcohol. family history includes Cancer in her maternal grandfather; Diabetes in her maternal grandmother; Hypertension in her mother; Sudden death (age of onset: 57) in her brother. No Known Allergies   Review of Systems  Constitutional: Negative for appetite change, chills, fever and unexpected weight change.  Respiratory: Negative for cough and shortness of breath.   Cardiovascular: Negative for chest pain.  Gastrointestinal: Negative for abdominal pain.  Genitourinary: Negative for dysuria.  Neurological: Positive for numbness. Negative for dizziness, tremors, syncope, speech difficulty, weakness and headaches.   Hematological: Negative for adenopathy. Does not bruise/bleed easily.       Objective:   Physical Exam Constitutional:      Appearance: Normal appearance.  Neck:     Musculoskeletal: Neck supple.  Cardiovascular:     Rate and Rhythm: Normal rate and regular rhythm.  Pulmonary:     Effort: Pulmonary effort is normal.     Breath sounds: Normal breath sounds.  Musculoskeletal:     Right lower leg: No edema.     Left lower leg: No edema.  Lymphadenopathy:     Cervical: No cervical adenopathy.  Skin:    Findings: No rash.  Neurological:     General: No focal deficit present.     Mental Status: She is alert and oriented to person, place, and time.     Cranial Nerves: No cranial nerve deficit.     Motor: No weakness.     Gait: Gait normal.     Comments: She has intact sensory function with monofilament testing bilaterally hands and feet  Romberg test is normal.  No focal strength deficits.  Symmetric reflexes upper and lower extremities which are 2+ throughout        Assessment:     Patient presents with several week history of bilateral upper and lower extremity paresthesias involving hands and feet.  Otherwise denies any focal neurologic concerns and has nonfocal exam.    Plan:     -Check further labs with CBC, comprehensive metabolic panel, hemoglobin A1c, TSH, B12, serum protein electrophoresis -  Consider neurology referral if all the above normal  Kristian Covey MD [facility] at [facility]

## 2018-08-26 LAB — COMPREHENSIVE METABOLIC PANEL
ALT: 18 U/L (ref 0–35)
AST: 14 U/L (ref 0–37)
Albumin: 4.4 g/dL (ref 3.5–5.2)
Alkaline Phosphatase: 93 U/L (ref 39–117)
BILIRUBIN TOTAL: 0.2 mg/dL (ref 0.2–1.2)
BUN: 14 mg/dL (ref 6–23)
CO2: 26 meq/L (ref 19–32)
CREATININE: 0.66 mg/dL (ref 0.40–1.20)
Calcium: 9.4 mg/dL (ref 8.4–10.5)
Chloride: 105 mEq/L (ref 96–112)
GFR: 101.35 mL/min (ref >60.00)
Glucose, Bld: 96 mg/dL (ref 70–99)
Potassium: 4 mEq/L (ref 3.5–5.1)
Sodium: 140 mEq/L (ref 135–145)
Total Protein: 7.5 g/dL (ref 6.0–8.3)

## 2018-08-26 LAB — CBC WITH DIFFERENTIAL/PLATELET
BASOS ABS: 0 10*3/uL (ref 0.0–0.1)
Basophils Relative: 0.5 % (ref 0.0–3.0)
EOS ABS: 0.1 10*3/uL (ref 0.0–0.7)
Eosinophils Relative: 2.1 % (ref 0.0–5.0)
HCT: 38.4 % (ref 36.0–46.0)
Hemoglobin: 13 g/dL (ref 12.0–15.0)
LYMPHS ABS: 2.6 10*3/uL (ref 0.7–4.0)
Lymphocytes Relative: 38.1 % (ref 12.0–46.0)
MCHC: 33.9 g/dL (ref 30.0–36.0)
MCV: 94.3 fl (ref 78.0–100.0)
Monocytes Absolute: 0.4 10*3/uL (ref 0.1–1.0)
Monocytes Relative: 5.9 % (ref 3.0–12.0)
NEUTROS ABS: 3.7 10*3/uL (ref 1.4–7.7)
Neutrophils Relative %: 53.4 % (ref 43.0–77.0)
PLATELETS: 264 10*3/uL (ref 150.0–400.0)
RBC: 4.07 Mil/uL (ref 3.87–5.11)
RDW: 13 % (ref 11.5–15.5)
WBC: 6.9 10*3/uL (ref 4.0–10.5)

## 2018-08-26 LAB — HEMOGLOBIN A1C: Hgb A1c MFr Bld: 5.8 % (ref 4.6–6.5)

## 2018-08-26 LAB — TSH: TSH: 2.69 u[IU]/mL (ref 0.35–4.50)

## 2018-08-26 LAB — VITAMIN B12: Vitamin B-12: 197 pg/mL — ABNORMAL LOW (ref 211–911)

## 2018-08-27 LAB — PROTEIN ELECTROPHORESIS, SERUM
ALBUMIN ELP: 3.9 g/dL (ref 3.8–4.8)
ALPHA 1: 0.3 g/dL (ref 0.2–0.3)
ALPHA 2: 0.8 g/dL (ref 0.5–0.9)
Beta 2: 0.5 g/dL (ref 0.2–0.5)
Beta Globulin: 0.5 g/dL (ref 0.4–0.6)
Gamma Globulin: 1.3 g/dL (ref 0.8–1.7)
TOTAL PROTEIN: 7.3 g/dL (ref 6.1–8.1)

## 2018-09-03 ENCOUNTER — Encounter: Payer: Self-pay | Admitting: Family Medicine

## 2018-09-03 ENCOUNTER — Ambulatory Visit (INDEPENDENT_AMBULATORY_CARE_PROVIDER_SITE_OTHER): Payer: BLUE CROSS/BLUE SHIELD | Admitting: Family Medicine

## 2018-09-03 ENCOUNTER — Other Ambulatory Visit: Payer: Self-pay

## 2018-09-03 VITALS — BP 110/66 | HR 96 | Temp 97.6°F | Ht 61.5 in | Wt 176.3 lb

## 2018-09-03 DIAGNOSIS — Z124 Encounter for screening for malignant neoplasm of cervix: Secondary | ICD-10-CM | POA: Diagnosis not present

## 2018-09-03 DIAGNOSIS — N911 Secondary amenorrhea: Secondary | ICD-10-CM

## 2018-09-03 LAB — FOLLICLE STIMULATING HORMONE: FSH: 114.2 m[IU]/mL

## 2018-09-03 NOTE — Progress Notes (Signed)
  Subjective:     Patient ID: Julie Daniel, female   DOB: 1980/11/20, 38 y.o.   MRN: [id number]  HPI Patient is here requesting Pap smear.  Last Pap smear was about 3 years ago.  She states that she has not had any menstrual period now for about 2 years.  She has not had any consistent headaches.  No galactorrhea.  No spotting whatsoever.  She does not take any regular medications.  No family history of premature menopause.  No pelvic pain.  Does have small epidermal type cyst left breast which drains when she applies pressure.  No recent signs of infection.  Tender intermittently.  Past Medical History:  Diagnosis Date  . Chicken pox   . GERD (gastroesophageal reflux disease)   . History of ovarian cyst     treated woth OCPS  . Hx SBO   . Infertility of tubal origin    hx of blocked tube   . UTI (urinary tract infection)    Past Surgical History:  Procedure Laterality Date  . NASAL SEPTUM SURGERY  12/24/11    reports that she has been smoking cigarettes. She has a 5.00 pack-year smoking history. She has never used smokeless tobacco. She reports previous drug use. Drug: Marijuana. She reports that she does not drink alcohol. family history includes Cancer in her maternal grandfather; Diabetes in her maternal grandmother; Hypertension in her mother; Sudden death (age of onset: 35) in her brother. No Known Allergies   Review of Systems  Constitutional: Negative for chills and fever.  Gastrointestinal: Negative for abdominal pain.  Genitourinary: Negative for hematuria and pelvic pain.       Objective:   Physical Exam Constitutional:      Appearance: Normal appearance.  Cardiovascular:     Rate and Rhythm: Normal rate and regular rhythm.  Pulmonary:     Comments: Breasts are symmetric.  No mass. Genitourinary:    Comments: Pelvic exam reveals normal external genitalia.  Vaginal mucosa was normal in appearance.  Cervix is normal in appearance.  Pap smear obtained.  Bimanual  exam reveals no mass and no cervical motion tenderness. Neurological:     Mental Status: She is alert.        Assessment:     #1 cervix cancer screening and relatively low risk patient.  No prior history of cervix abnormalities  #2 secondary amenorrhea for 2 years duration.  Previous pregnancy screens negative      Plan:     -Pap smear obtained -Patient had recent TSH which was normal.  We will also check FSH and prolactin levels.  Kristian Covey MD [facility]

## 2018-09-03 NOTE — Patient Instructions (Signed)

## 2018-09-04 ENCOUNTER — Telehealth: Payer: Self-pay

## 2018-09-04 DIAGNOSIS — A599 Trichomoniasis, unspecified: Secondary | ICD-10-CM

## 2018-09-04 LAB — PAP IG W/ RFLX HPV ASCU

## 2018-09-04 LAB — PROLACTIN: Prolactin: 7.7 ng/mL

## 2018-09-04 MED ORDER — METRONIDAZOLE 500 MG PO TABS: 500.0000 mg | ORAL_TABLET | Freq: Two times a day (BID) | ORAL | 0 refills | Status: AC

## 2018-09-04 NOTE — Telephone Encounter (Signed)
Author phoned pt. and relayed recent lab results and recommendation. Flagyl sent into preferred pharmacy. No further questions for pt. at this time.

## 2018-11-28 ENCOUNTER — Telehealth: Payer: Self-pay | Admitting: Family Medicine

## 2018-11-28 NOTE — Telephone Encounter (Signed)
Left message with someone that answered the mobile phone number to reschedule her appointment. The person that answered stated that they will relay the message. I also called the number listed for the home phone and that person stated that we had the wrong number.

## 2018-12-01 ENCOUNTER — Ambulatory Visit: Payer: BC Managed Care – PPO | Admitting: Family Medicine

## 2018-12-01 NOTE — Telephone Encounter (Signed)
Called patient at both numbers listed and left a detailed voice message on both to have her call us back to reschedule appointment that was for today.  OK for [facility] to discuss/advise/schedule patient  CRM Created.

## 2020-02-04 ENCOUNTER — Encounter: Payer: Self-pay | Admitting: Adult Health

## 2020-02-04 ENCOUNTER — Ambulatory Visit (INDEPENDENT_AMBULATORY_CARE_PROVIDER_SITE_OTHER): Payer: BC Managed Care – PPO | Admitting: Adult Health

## 2020-02-04 VITALS — BP 110/70 | HR 113 | Temp 99.1°F | Ht 61.5 in | Wt 176.0 lb

## 2020-02-04 DIAGNOSIS — J0110 Acute frontal sinusitis, unspecified: Secondary | ICD-10-CM

## 2020-02-04 LAB — POCT RAPID STREP A (OFFICE): Rapid Strep A Screen: NEGATIVE

## 2020-02-04 MED ORDER — AMOXICILLIN-POT CLAVULANATE 875-125 MG PO TABS: 1.0000 | ORAL_TABLET | Freq: Two times a day (BID) | ORAL | 0 refills | Status: DC

## 2020-02-04 MED ORDER — PREDNISONE 10 MG PO TABS: 10.0000 mg | ORAL_TABLET | Freq: Every day | ORAL | 0 refills | Status: DC

## 2020-02-04 NOTE — Progress Notes (Signed)
Subjective:    Patient ID: Julie Daniel, female    DOB: 1981-05-21, 39 y.o.   MRN: [id number]  HPI  39 year old female who  has a past medical history of Chicken pox, GERD (gastroesophageal reflux disease), History of ovarian cyst, SBO, Infertility of tubal origin, and UTI (urinary tract infection).  She presents to the office today for an acute issue. Her symptoms include that of nasal congestion, sinus pain and pressure,  chills, sore, throa,t fever up to 101. Her symptoms started two days ago. She denies difficulty swallowing, SOB, CP, abdominal pain, n/v/d  She was vaccinated against Covid and received her second vaccination about 3 weeks ago.   She has been using OTC " fever reducer"   Review of Systems See HPI   Past Medical History:  Diagnosis Date  . Chicken pox   . GERD (gastroesophageal reflux disease)   . History of ovarian cyst     treated woth OCPS  . Hx SBO   . Infertility of tubal origin    hx of blocked tube   . UTI (urinary tract infection)     Social History   Socioeconomic History  . Marital status: Single    Spouse name: Not on file  . Number of children: Not on file  . Years of education: Not on file  . Highest education level: Not on file  Occupational History  . Not on file  Tobacco Use  . Smoking status: Current Every Day Smoker    Packs/day: 0.50    Years: 10.00    Pack years: 5.00    Types: Cigarettes  . Smokeless tobacco: Never Used  Vaping Use  . Vaping Use: Never used  Substance and Sexual Activity  . Alcohol use: No  . Drug use: Not Currently    Types: Marijuana  . Sexual activity: Not Currently  Other Topics Concern  . Not on file  Social History Narrative   Separated    Tobacco    Social Determinants of Health   Financial Resource Strain:   . Difficulty of Paying Living Expenses: Not on file  Food Insecurity:   . Worried About Programme researcher, broadcasting/film/video in the Last Year: Not on file  . Ran Out of Food in the Last Year:  Not on file  Transportation Needs:   . Lack of Transportation (Medical): Not on file  . Lack of Transportation (Non-Medical): Not on file  Physical Activity:   . Days of Exercise per Week: Not on file  . Minutes of Exercise per Session: Not on file  Stress:   . Feeling of Stress : Not on file  Social Connections:   . Frequency of Communication with Friends and Family: Not on file  . Frequency of Social Gatherings with Friends and Family: Not on file  . Attends Religious Services: Not on file  . Active Member of Clubs or Organizations: Not on file  . Attends Banker Meetings: Not on file  . Marital Status: Not on file  Intimate Partner Violence:   . Fear of Current or Ex-Partner: Not on file  . Emotionally Abused: Not on file  . Physically Abused: Not on file  . Sexually Abused: Not on file    Past Surgical History:  Procedure Laterality Date  . NASAL SEPTUM SURGERY  12/24/11    Family History  Problem Relation Age of Onset  . Hypertension Mother   . Sudden death Brother 54  . Diabetes Maternal Grandmother   .  Cancer Maternal Grandfather        lung, throat    No Known Allergies  No current outpatient medications on file prior to visit.   No current facility-administered medications on file prior to visit.    BP 110/70   Pulse (!) 113   Temp 99.1 F (37.3 C) (Oral)   Ht 5' 1.5" (1.562 m)   Wt 176 lb (79.8 kg)   SpO2 97%   BMI 32.72 kg/m       Objective:   Physical Exam Vitals and nursing note reviewed.  Constitutional:      Appearance: She is well-developed.  HENT:     Head: Normocephalic and atraumatic.     Nose: Congestion present.     Right Turbinates: Enlarged and swollen.     Left Turbinates: Enlarged and swollen.     Right Sinus: Frontal sinus tenderness present. No maxillary sinus tenderness.     Left Sinus: Frontal sinus tenderness present. No maxillary sinus tenderness.     Mouth/Throat:     Mouth: Mucous membranes are moist.      Pharynx: Pharyngeal swelling and posterior oropharyngeal erythema present. No oropharyngeal exudate or uvula swelling.     Tonsils: No tonsillar exudate or tonsillar abscesses.  Eyes:     Extraocular Movements: Extraocular movements intact.     Pupils: Pupils are equal, round, and reactive to light.  Musculoskeletal:     Cervical back: Normal range of motion and neck supple.  Lymphadenopathy:     Head:     Right side of head: Submental, submandibular, tonsillar and preauricular adenopathy present. No posterior auricular or occipital adenopathy.     Left side of head: Submental, submandibular, tonsillar and preauricular adenopathy present. No posterior auricular or occipital adenopathy.     Cervical: No cervical adenopathy.  Neurological:     Mental Status: She is alert.       Assessment & Plan:  1. Acute non-recurrent frontal sinusitis And strep test negative. Appears as more sinusitis. Will treat with Augmentin twice daily x10 days and prescribe some prednisone for her mild pharyngeal swelling. No signs of strep throat or peritonsillar abscess noted. - amoxicillin-clavulanate (AUGMENTIN) 875-125 MG tablet; Take 1 tablet by mouth 2 (two) times daily.  Dispense: 20 tablet; Refill: 0 - predniSONE (DELTASONE) 10 MG tablet; Take 1 tablet (10 mg total) by mouth daily with breakfast.  Dispense: 5 tablet; Refill: 0 - POCT rapid strep A- negative   Shirline Frees, NP

## 2020-02-08 ENCOUNTER — Ambulatory Visit (HOSPITAL_COMMUNITY)
Admission: EM | Admit: 2020-02-08 | Discharge: 2020-02-08 | Disposition: A | Discharge status: Home / Self Care | Payer: BC Managed Care – PPO | Attending: Physician Assistant | Admitting: Physician Assistant

## 2020-02-08 ENCOUNTER — Encounter (HOSPITAL_COMMUNITY): Payer: Self-pay

## 2020-02-08 DIAGNOSIS — R519 Headache, unspecified: Secondary | ICD-10-CM | POA: Diagnosis not present

## 2020-02-08 DIAGNOSIS — R197 Diarrhea, unspecified: Secondary | ICD-10-CM | POA: Diagnosis not present

## 2020-02-08 DIAGNOSIS — B349 Viral infection, unspecified: Secondary | ICD-10-CM | POA: Diagnosis not present

## 2020-02-08 DIAGNOSIS — Z20822 Contact with and (suspected) exposure to covid-19: Secondary | ICD-10-CM | POA: Diagnosis not present

## 2020-02-08 NOTE — Discharge Instructions (Signed)

## 2020-02-08 NOTE — ED Triage Notes (Signed)
Pt presents with complaints of needing covid testing. Reports last week she was having diarrhea, headache, and fever that started on Wednesday. She then found out this week she may have been exposed to covid. She is feeling better today.

## 2020-02-08 NOTE — ED Provider Notes (Signed)
[facility]   MRN: [id number] DOB: [birth year]  Subjective:   Julie Daniel is a 39 y.o. female presenting for 5-day history of acute onset malaise, headaches, fever, throat pain, cough, chest pain, diarrhea.  Patient states that she feels somewhat improved except for the cough and the chest pain.  She did see her PCP and they prescribed her Augmentin and prednisone for sinusitis.  She tried to tell them that she did not have any sinus pain, they did not want to do COVID-19 testing.  Ultimately, patient found out that her family had Covid exposure through one of her sons and his daycare class.  No current facility-administered medications for this encounter.  Current Outpatient Medications:  .  amoxicillin-clavulanate (AUGMENTIN) 875-125 MG tablet, Take 1 tablet by mouth 2 (two) times daily., Disp: 20 tablet, Rfl: 0 .  predniSONE (DELTASONE) 10 MG tablet, Take 1 tablet (10 mg total) by mouth daily with breakfast., Disp: 5 tablet, Rfl: 0   No Known Allergies  Past Medical History:  Diagnosis Date  . Chicken pox   . GERD (gastroesophageal reflux disease)   . History of ovarian cyst     treated woth OCPS  . Hx SBO   . Infertility of tubal origin    hx of blocked tube   . UTI (urinary tract infection)      Past Surgical History:  Procedure Laterality Date  . NASAL SEPTUM SURGERY  12/24/11    Family History  Problem Relation Age of Onset  . Hypertension Mother   . Sudden death Brother 63  . Diabetes Maternal Grandmother   . Cancer Maternal Grandfather        lung, throat    Social History   Tobacco Use  . Smoking status: Current Every Day Smoker    Packs/day: 0.50    Years: 10.00    Pack years: 5.00    Types: Cigarettes  . Smokeless tobacco: Never Used  Vaping Use  . Vaping Use: Never used  Substance Use Topics  . Alcohol use: No  . Drug use: Not Currently    Types: Marijuana    ROS   Objective:   Vitals: BP 121/77   Pulse 73   Temp 98.3 F  (36.8 C)   Resp 18   SpO2 98%   Physical Exam Constitutional:      General: She is not in acute distress.    Appearance: Normal appearance. She is well-developed. She is not ill-appearing, toxic-appearing or diaphoretic.  HENT:     Head: Normocephalic and atraumatic.     Nose: Nose normal.     Mouth/Throat:     Mouth: Mucous membranes are moist.  Eyes:     Extraocular Movements: Extraocular movements intact.     Pupils: Pupils are equal, round, and reactive to light.  Cardiovascular:     Rate and Rhythm: Normal rate and regular rhythm.     Pulses: Normal pulses.     Heart sounds: Normal heart sounds. No murmur heard.  No friction rub. No gallop.   Pulmonary:     Effort: Pulmonary effort is normal. No respiratory distress.     Breath sounds: Normal breath sounds. No stridor. No wheezing, rhonchi or rales.  Skin:    General: Skin is warm and dry.     Findings: No rash.  Neurological:     Mental Status: She is alert and oriented to person, place, and time.  Psychiatric:        Mood and  Affect: Mood normal.        Behavior: Behavior normal.        Thought Content: Thought content normal.        Judgment: Judgment normal.       Assessment and Plan :   PDMP not reviewed this encounter.  1. Viral illness   2. Diarrhea, unspecified type   3. Generalized headache   4. Exposure to COVID-19 virus     Patient is very well-appearing but symptoms that highly suspicious for COVID-19.  Recommend supportive care.  Follow-up with PCP to revisit her medication regimen.  Provided her with a note for her job. Counseled patient on potential for adverse effects with medications prescribed/recommended today, ER and return-to-clinic precautions discussed, patient verbalized understanding.    Wallis Bamberg, [place] 02/08/20 1843

## 2020-02-09 LAB — SARS CORONAVIRUS 2 (TAT 6-24 HRS): SARS Coronavirus 2: NEGATIVE

## 2020-02-25 ENCOUNTER — Ambulatory Visit (HOSPITAL_COMMUNITY)
Admission: EM | Admit: 2020-02-25 | Discharge: 2020-02-25 | Disposition: A | Discharge status: Home / Self Care | Payer: BC Managed Care – PPO | Attending: Nurse Practitioner | Admitting: Nurse Practitioner

## 2020-02-25 ENCOUNTER — Telehealth (HOSPITAL_COMMUNITY): Payer: Self-pay | Admitting: Emergency Medicine

## 2020-02-25 ENCOUNTER — Other Ambulatory Visit: Payer: Self-pay

## 2020-02-25 ENCOUNTER — Encounter (HOSPITAL_COMMUNITY): Payer: Self-pay

## 2020-02-25 DIAGNOSIS — J029 Acute pharyngitis, unspecified: Secondary | ICD-10-CM | POA: Diagnosis not present

## 2020-02-25 LAB — POCT RAPID STREP A, ED / UC: Streptococcus, Group A Screen (Direct): NEGATIVE

## 2020-02-25 MED ORDER — DOXYCYCLINE HYCLATE 100 MG PO CAPS: 100.0000 mg | ORAL_CAPSULE | Freq: Two times a day (BID) | ORAL | 0 refills | Status: DC

## 2020-02-25 MED ORDER — MAGIC MOUTHWASH W/LIDOCAINE: 5.0000 mL | Freq: Three times a day (TID) | ORAL | 0 refills | Status: DC | PRN

## 2020-02-25 NOTE — ED Triage Notes (Addendum)
Pt presents with complaints of sore throat on the right side. Pt reports that she was tested for covid last week and it was negative. She also was diagnosed with a sinus infection. She has had continued cough that causes rib pain as well. Pt just finished taking amoxicillin.

## 2020-02-25 NOTE — ED Provider Notes (Signed)
[facility]    CSN: [id number] Arrival date & time: 02/25/20  1747      History   Chief Complaint Chief Complaint  Patient presents with  . Sore Throat    HPI Julie Daniel is a 39 y.o. female.   Subjective:   Julie Daniel is a 39 y.o. female who presents for evaluation of a sore throat. She denies any associating symptoms. She was treated for a sinus infection a few weeks ago. All of her symptoms have resolved except the sore throat. The pain is mostly on the right side. She is drinking plenty of fluids. She has not had recent close exposure to someone with proven streptococcal pharyngitis. No known COVID exposure. No history of COVID. She had a negative COVID test on 02/08/20.   The following portions of the patient's history were reviewed and updated as appropriate: allergies, current medications, past family history, past medical history, past social history, past surgical history and problem list.       Past Medical History:  Diagnosis Date  . Chicken pox   . GERD (gastroesophageal reflux disease)   . History of ovarian cyst     treated woth OCPS  . Hx SBO   . Infertility of tubal origin    hx of blocked tube   . UTI (urinary tract infection)     Patient Active Problem List   Diagnosis Date Noted  . Acute streptococcal pharyngitis 11/11/2012  . Breast cyst 11/11/2012  . Pilonidal cyst with abscess 01/10/2012  . Infertility of tubal origin   . Nicotine abuse 01/25/2011    Past Surgical History:  Procedure Laterality Date  . NASAL SEPTUM SURGERY  12/24/11    OB History   No obstetric history on file.      Home Medications    Prior to Admission medications   Medication Sig Start Date End Date Taking? Authorizing Provider  doxycycline (VIBRAMYCIN) 100 MG capsule Take 1 capsule (100 mg total) by mouth 2 (two) times daily. 02/25/20   Lurline Idol, FNP  magic mouthwash w/lidocaine SOLN Take 5 mLs by mouth 3 (three) times daily as  needed (sore throat). Gargle with 5 mLs three times a day as needed for sore throat 02/25/20   Lurline Idol, FNP    Family History Family History  Problem Relation Age of Onset  . Hypertension Mother   . Sudden death Brother 34  . Diabetes Maternal Grandmother   . Cancer Maternal Grandfather        lung, throat    Social History Social History   Tobacco Use  . Smoking status: Current Every Day Smoker    Packs/day: 0.50    Years: 10.00    Pack years: 5.00    Types: Cigarettes  . Smokeless tobacco: Never Used  Vaping Use  . Vaping Use: Never used  Substance Use Topics  . Alcohol use: No  . Drug use: Not Currently    Types: Marijuana     Allergies   Patient has no known allergies.   Review of Systems Review of Systems  Constitutional: Negative for fever.  HENT: Positive for sore throat. Negative for trouble swallowing.   Respiratory: Negative.   Gastrointestinal: Negative.   Neurological: Negative.      Physical Exam Triage Vital Signs ED Triage Vitals  Enc Vitals Group     BP 02/25/20 1927 111/81     Pulse Rate 02/25/20 1927 75     Resp 02/25/20 1927 19  Temp 02/25/20 1927 98 F (36.7 C)     Temp src --      SpO2 02/25/20 1927 100 %     Weight --      Height --      Head Circumference --      Peak Flow --      Pain Score 02/25/20 1925 6     Pain Loc --      Pain Edu? --      Excl. in GC? --    No data found.  Updated Vital Signs BP 111/81   Pulse 75   Temp 98 F (36.7 C)   Resp 19   SpO2 100%   Visual Acuity Right Eye Distance:   Left Eye Distance:   Bilateral Distance:    Right Eye Near:   Left Eye Near:    Bilateral Near:     Physical Exam Vitals reviewed.  Constitutional:      Appearance: She is well-developed.  HENT:     Head: Normocephalic.     Right Ear: Tympanic membrane and ear canal normal.     Left Ear: Tympanic membrane and ear canal normal.     Mouth/Throat:     Mouth: Mucous membranes are moist. No oral  lesions.     Pharynx: Uvula midline. Pharyngeal swelling and posterior oropharyngeal erythema present. No oropharyngeal exudate or uvula swelling.     Tonsils: No tonsillar exudate or tonsillar abscesses.  Eyes:     Conjunctiva/sclera: Conjunctivae normal.  Cardiovascular:     Rate and Rhythm: Normal rate and regular rhythm.     Heart sounds: Normal heart sounds.  Pulmonary:     Effort: Pulmonary effort is normal.     Breath sounds: Normal breath sounds.  Musculoskeletal:     Cervical back: Normal range of motion and neck supple.  Lymphadenopathy:     Cervical: No cervical adenopathy.  Skin:    General: Skin is warm and dry.  Neurological:     General: No focal deficit present.     Mental Status: She is alert and oriented to person, place, and time.  Psychiatric:        Mood and Affect: Mood normal.        Behavior: Behavior normal.      UC Treatments / Results  Labs (all labs ordered are listed, but only abnormal results are displayed) Labs Reviewed  CULTURE, GROUP A STREP [facility])  POCT RAPID STREP A, ED / UC    EKG   Radiology No results found.  Procedures Procedures (including critical care time)  Medications Ordered in UC Medications - No data to display  Initial Impression / Assessment and Plan / UC Course  I have reviewed the triage vital signs and the nursing notes.  Pertinent labs & imaging results that were available during my care of the patient were reviewed by me and considered in my medical decision making (see chart for details).    39 yo female presenting with sore throat. She is afebrile and nontoxic appearing. Rapid strep with cultures pending.   Plan:  Patient placed on antibiotics. Use of OTC analgesics recommended as well as salt water gargles. Follow up as needed.  Today's evaluation has revealed no signs of a dangerous process. Discussed diagnosis with patient and/or guardian. Patient and/or guardian aware of their diagnosis, possible  red flag symptoms to watch out for and need for close follow up. Patient and/or guardian understands verbal and written discharge instructions. Patient  and/or guardian comfortable with plan and disposition.  Patient and/or guardian has a clear mental status at this time, good insight into illness (after discussion and teaching) and has clear judgment to make decisions regarding their care  This care was provided during an unprecedented National Emergency due to the Novel Coronavirus (COVID-19) pandemic. COVID-19 infections and transmission risks place heavy strains on healthcare resources.  As this pandemic evolves, our facility, providers, and staff strive to respond fluidly, to remain operational, and to provide care relative to available resources and information. Outcomes are unpredictable and treatments are without well-defined guidelines. Further, the impact of COVID-19 on all aspects of urgent care, including the impact to patients seeking care for reasons other than COVID-19, is unavoidable during this national emergency. At this time of the global pandemic, management of patients has significantly changed, even for non-COVID positive patients given high local and regional COVID volumes at this time requiring high healthcare system and resource utilization. The standard of care for management of both COVID suspected and non-COVID suspected patients continues to change rapidly at the local, regional, national, and global levels. This patient was worked up and treated to the best available but ever changing evidence and resources available at this current time.   Documentation was completed with the aid of voice recognition software. Transcription may contain typographical errors.  Final Clinical Impressions(s) / UC Diagnoses   Final diagnoses:  Pharyngitis, unspecified etiology   Discharge Instructions   None    ED Prescriptions    Medication Sig Dispense Auth. Provider   doxycycline  (VIBRAMYCIN) 100 MG capsule Take 1 capsule (100 mg total) by mouth 2 (two) times daily. 20 capsule Lurline Idol, FNP   magic mouthwash w/lidocaine SOLN Take 5 mLs by mouth 3 (three) times daily as needed (sore throat). Gargle with 5 mLs three times a day as needed for sore throat 75 mL Lurline Idol, FNP     PDMP not reviewed this encounter.   Lurline Idol, [place] 02/25/20 2057

## 2020-02-25 NOTE — Telephone Encounter (Signed)
Pt requested medication go to [facility].

## 2020-02-28 LAB — CULTURE, GROUP A STREP (THRC)

## 2020-07-05 ENCOUNTER — Other Ambulatory Visit: Payer: BC Managed Care – PPO

## 2021-04-22 ENCOUNTER — Encounter (HOSPITAL_BASED_OUTPATIENT_CLINIC_OR_DEPARTMENT_OTHER): Payer: Self-pay | Admitting: *Deleted

## 2021-04-22 ENCOUNTER — Other Ambulatory Visit: Payer: Self-pay

## 2021-04-22 ENCOUNTER — Emergency Department (HOSPITAL_BASED_OUTPATIENT_CLINIC_OR_DEPARTMENT_OTHER): Payer: BC Managed Care – PPO

## 2021-04-22 ENCOUNTER — Emergency Department (HOSPITAL_BASED_OUTPATIENT_CLINIC_OR_DEPARTMENT_OTHER)
Admission: EM | Admit: 2021-04-22 | Discharge: 2021-04-22 | Disposition: A | Discharge status: Home / Self Care | Payer: BC Managed Care – PPO | Attending: Emergency Medicine | Admitting: Emergency Medicine

## 2021-04-22 DIAGNOSIS — E876 Hypokalemia: Secondary | ICD-10-CM | POA: Insufficient documentation

## 2021-04-22 DIAGNOSIS — R1032 Left lower quadrant pain: Secondary | ICD-10-CM | POA: Diagnosis not present

## 2021-04-22 DIAGNOSIS — F17210 Nicotine dependence, cigarettes, uncomplicated: Secondary | ICD-10-CM | POA: Diagnosis not present

## 2021-04-22 DIAGNOSIS — Z0389 Encounter for observation for other suspected diseases and conditions ruled out: Secondary | ICD-10-CM | POA: Diagnosis not present

## 2021-04-22 DIAGNOSIS — R109 Unspecified abdominal pain: Secondary | ICD-10-CM

## 2021-04-22 DIAGNOSIS — R1030 Lower abdominal pain, unspecified: Secondary | ICD-10-CM | POA: Diagnosis not present

## 2021-04-22 DIAGNOSIS — R1031 Right lower quadrant pain: Secondary | ICD-10-CM | POA: Diagnosis not present

## 2021-04-22 LAB — URINALYSIS, ROUTINE W REFLEX MICROSCOPIC
Bilirubin Urine: NEGATIVE
Glucose, UA: NEGATIVE mg/dL
Ketones, ur: NEGATIVE mg/dL
Leukocytes,Ua: NEGATIVE
Nitrite: NEGATIVE
Specific Gravity, Urine: 1.028 (ref 1.005–1.030)
pH: 6 (ref 5.0–8.0)

## 2021-04-22 LAB — CBC
HCT: 38.9 % (ref 36.0–46.0)
Hemoglobin: 12.5 g/dL (ref 12.0–15.0)
MCH: 31.2 pg (ref 26.0–34.0)
MCHC: 32.1 g/dL (ref 30.0–36.0)
MCV: 97 fL (ref 80.0–100.0)
Platelets: 313 10*3/uL (ref 150–400)
RBC: 4.01 MIL/uL (ref 3.87–5.11)
RDW: 12.9 % (ref 11.5–15.5)
WBC: 8.9 10*3/uL (ref 4.0–10.5)
nRBC: 0 % (ref 0.0–0.2)

## 2021-04-22 LAB — COMPREHENSIVE METABOLIC PANEL
ALT: 9 U/L (ref 0–44)
AST: 15 U/L (ref 15–41)
Albumin: 4.4 g/dL (ref 3.5–5.0)
Alkaline Phosphatase: 70 U/L (ref 38–126)
Anion gap: 8 (ref 5–15)
BUN: 12 mg/dL (ref 6–20)
CO2: 30 mmol/L (ref 22–32)
Calcium: 8.8 mg/dL — ABNORMAL LOW (ref 8.9–10.3)
Chloride: 101 mmol/L (ref 98–111)
Creatinine, Ser: 0.72 mg/dL (ref 0.44–1.00)
GFR, Estimated: >60 mL/min (ref >60)
Glucose, Bld: 98 mg/dL (ref 70–99)
Potassium: 3.2 mmol/L — ABNORMAL LOW (ref 3.5–5.1)
Sodium: 139 mmol/L (ref 135–145)
Total Bilirubin: 0.4 mg/dL (ref 0.3–1.2)
Total Protein: 8.3 g/dL — ABNORMAL HIGH (ref 6.5–8.1)

## 2021-04-22 LAB — PREGNANCY, URINE: Preg Test, Ur: NEGATIVE

## 2021-04-22 LAB — LIPASE, BLOOD: Lipase: 23 U/L (ref 11–51)

## 2021-04-22 MED ORDER — IOHEXOL 350 MG/ML SOLN
100.0000 mL | Freq: Once | INTRAVENOUS | Status: AC | PRN
  Administered 2021-04-22: 85 mL via INTRAVENOUS

## 2021-04-22 NOTE — ED Triage Notes (Addendum)
Lower abd pain started last night. Denies discharge, difficulty urinating. Pt states it feels as if a belt is around her lower abd. N/V/D with symptoms

## 2021-04-22 NOTE — ED Provider Notes (Signed)
MEDCENTER [facility] EMERGENCY DEPT Provider Note   CSN: [id number] Arrival date & time: 04/22/21  1044     History Chief Complaint  Patient presents with   Abdominal Pain    Julie Daniel is a 40 y.o. female.  40 year old female presents with lower abdominal pain that began last night.  No vaginal symptoms.  No urinary symptoms.  States that it is been persistent and associated with some loose stools.  Does have a history of small bowel obstruction in the past.  No treatment use prior to arrival      Past Medical History:  Diagnosis Date   Chicken pox    GERD (gastroesophageal reflux disease)    History of ovarian cyst     treated woth OCPS   Hx SBO    Infertility of tubal origin    hx of blocked tube    UTI (urinary tract infection)     Patient Active Problem List   Diagnosis Date Noted   Acute streptococcal pharyngitis 11/11/2012   Breast cyst 11/11/2012   Pilonidal cyst with abscess 01/10/2012   Infertility of tubal origin    Nicotine abuse 01/25/2011    Past Surgical History:  Procedure Laterality Date   NASAL SEPTUM SURGERY  12/24/11     OB History   No obstetric history on file.     Family History  Problem Relation Age of Onset   Hypertension Mother    Sudden death Brother 91   Diabetes Maternal Grandmother    Cancer Maternal Grandfather        lung, throat    Social History   Tobacco Use   Smoking status: Every Day    Packs/day: 0.50    Years: 10.00    Pack years: 5.00    Types: Cigarettes   Smokeless tobacco: Never  Vaping Use   Vaping Use: Some days  Substance Use Topics   Alcohol use: No   Drug use: Not Currently    Types: Marijuana    Home Medications Prior to Admission medications   Medication Sig Start Date End Date Taking? Authorizing Provider  doxycycline (VIBRAMYCIN) 100 MG capsule Take 1 capsule (100 mg total) by mouth 2 (two) times daily. 02/25/20   Lurline Idol, FNP  magic mouthwash w/lidocaine SOLN Take 5  mLs by mouth 3 (three) times daily as needed (sore throat). Gargle with 5 mLs three times a day as needed for sore throat 02/25/20   Lurline Idol, FNP    Allergies    Patient has no known allergies.  Review of Systems   Review of Systems  All other systems reviewed and are negative.  Physical Exam Updated Vital Signs BP 134/87   Pulse 76   Temp 98 F (36.7 C)   Resp 18   Ht 1.575 m (5\' 2" )   Wt 74.4 kg   LMP 07/10/2015 (Approximate)   SpO2 97%   BMI 30.00 kg/m   Physical Exam Vitals and nursing note reviewed.  Constitutional:      General: She is not in acute distress.    Appearance: Normal appearance. She is well-developed. She is not toxic-appearing.  HENT:     Head: Normocephalic and atraumatic.  Eyes:     General: Lids are normal.     Conjunctiva/sclera: Conjunctivae normal.     Pupils: Pupils are equal, round, and reactive to light.  Neck:     Thyroid: No thyroid mass.     Trachea: No tracheal deviation.  Cardiovascular:  Rate and Rhythm: Normal rate and regular rhythm.     Heart sounds: Normal heart sounds. No murmur heard.   No gallop.  Pulmonary:     Effort: Pulmonary effort is normal. No respiratory distress.     Breath sounds: Normal breath sounds. No stridor. No decreased breath sounds, wheezing, rhonchi or rales.  Abdominal:     General: There is no distension.     Palpations: Abdomen is soft.     Tenderness: There is abdominal tenderness in the right lower quadrant and left lower quadrant. There is no rebound.  Musculoskeletal:        General: No tenderness. Normal range of motion.     Cervical back: Normal range of motion and neck supple.  Skin:    General: Skin is warm and dry.     Findings: No abrasion or rash.  Neurological:     Mental Status: She is alert and oriented to person, place, and time. Mental status is at baseline.     GCS: GCS eye subscore is 4. GCS verbal subscore is 5. GCS motor subscore is 6.     Cranial Nerves: No  cranial nerve deficit.     Sensory: No sensory deficit.     Motor: Motor function is intact.  Psychiatric:        Attention and Perception: Attention normal.        Speech: Speech normal.        Behavior: Behavior normal.    ED Results / Procedures / Treatments   Labs (all labs ordered are listed, but only abnormal results are displayed) Labs Reviewed  COMPREHENSIVE METABOLIC PANEL - Abnormal; Notable for the following components:      Result Value   Potassium 3.2 (*)    Calcium 8.8 (*)    Total Protein 8.3 (*)    All other components within normal limits  URINALYSIS, ROUTINE W REFLEX MICROSCOPIC - Abnormal; Notable for the following components:   Hgb urine dipstick TRACE (*)    Protein, ur TRACE (*)    Bacteria, UA RARE (*)    All other components within normal limits  LIPASE, BLOOD  CBC  PREGNANCY, URINE    EKG None  Radiology No results found.  Procedures Procedures   Medications Ordered in ED Medications - No data to display  ED Course  I have reviewed the triage vital signs and the nursing notes.  Pertinent labs & imaging results that were available during my care of the patient were reviewed by me and considered in my medical decision making (see chart for details).    MDM Rules/Calculators/A&P                           Mild hypokalemia noted on head CT.  Labs are reassuring as well as abdominal CT.  Repeat abdominal exam shows no evidence of peritonitis.  Will discharge home Final Clinical Impression(s) / ED Diagnoses Final diagnoses:  None    Rx / DC Orders ED Discharge Orders     None        Lorre Nick, MD 04/22/21 1433

## 2022-06-26 ENCOUNTER — Telehealth (INDEPENDENT_AMBULATORY_CARE_PROVIDER_SITE_OTHER): Payer: BC Managed Care – PPO | Admitting: Family Medicine

## 2022-06-26 ENCOUNTER — Encounter: Payer: Self-pay | Admitting: Family Medicine

## 2022-06-26 VITALS — Wt 170.0 lb

## 2022-06-26 DIAGNOSIS — R0981 Nasal congestion: Secondary | ICD-10-CM | POA: Diagnosis not present

## 2022-06-26 DIAGNOSIS — R062 Wheezing: Secondary | ICD-10-CM | POA: Diagnosis not present

## 2022-06-26 DIAGNOSIS — R059 Cough, unspecified: Secondary | ICD-10-CM

## 2022-06-26 DIAGNOSIS — Z20828 Contact with and (suspected) exposure to other viral communicable diseases: Secondary | ICD-10-CM | POA: Diagnosis not present

## 2022-06-26 MED ORDER — ALBUTEROL SULFATE HFA 108 (90 BASE) MCG/ACT IN AERS: 2.0000 | INHALATION_SPRAY | Freq: Four times a day (QID) | RESPIRATORY_TRACT | 0 refills | Status: DC | PRN

## 2022-06-26 MED ORDER — BENZONATATE 100 MG PO CAPS: ORAL_CAPSULE | ORAL | 0 refills | Status: DC

## 2022-06-26 NOTE — Patient Instructions (Signed)
  HOME CARE TIPS:  -COVID19 testing information: [email or web address]  Most pharmacies also offer testing and home test kits. If the Covid19 test is positive and you desire antiviral treatment, please contact a [facility] or schedule a follow up virtual visit through your primary care office or through the [employer].  Other test to treat options: [email or web address]  -I sent the medication(s) we discussed to your pharmacy: Meds ordered this encounter  Medications   albuterol (PROAIR HFA) 108 (90 Base) MCG/ACT inhaler    Sig: Inhale 2 puffs into the lungs every 6 (six) hours as needed for wheezing or shortness of breath.    Dispense:  1 each    Refill:  0   benzonatate (TESSALON PERLES) 100 MG capsule    Sig: 1-2 capsules up to twice daily as needed for cough.    Dispense:  30 capsule    Refill:  0   -can use tylenol or aleve if needed for fevers, aches and pains per instructions  -nasal saline sinus rinses twice daily  -stay hydrated, drink plenty of fluids and eat small healthy meals - avoid dairy  -follow up with your doctor in 2-3 days unless improving and feeling better  -stay home while sick, except to seek medical care. If you have COVID19, you will likely be contagious for 7-10 days. Flu or Influenza is likely contagious for about 7 days. Other respiratory viral infections remain contagious for 5-10+ days depending on the virus and many other factors. Wear a good mask that fits snugly (such as N95 or KN95) if around others to reduce the risk of transmission.  It was nice to meet you today, and I really hope you are feeling better soon. I help [facility] out with telemedicine visits on Tuesdays and Thursdays and am happy to help if you need a follow up virtual visit on those days. Otherwise, if you have any concerns or questions following this visit please schedule a follow up visit with your Primary Care  doctor or seek care at a local urgent care clinic to avoid delays in care.    Seek in person care or schedule a follow up video visit promptly if your symptoms worsen, new concerns arise or you are not improving with treatment. Call 911 and/or seek emergency care if your symptoms are severe or life threatening.

## 2022-06-26 NOTE — Progress Notes (Signed)
Virtual Visit via Video Note  I connected with Julie Daniel  on 06/26/22 at 12:40 PM EST by a video enabled telemedicine application and verified that I am speaking with the correct person using two identifiers.  Location patient: [place] Location provider:work or home office Persons participating in the virtual visit: patient, provider  I discussed the limitations and requested verbal permission for telemedicine visit. The patient expressed understanding and agreed to proceed.   HPI:  Acute telemedicine visit for cough and congestion: -Onset:3 days ago -she took her son to the doctor today for the same and he was diagnosed with influenza A -Symptoms include: low grade temp, cough, body aches, nasal congestion, had some vomiting yesterday, feels like has a little bit of wheeze in upper chest at times -Denies: CP, SOB, diarrhea -Has tried: OTC cough and cold meds -Pertinent past medical history: see below -Pertinent medication allergies:No Known Allergies -COVID-19 vaccine status:  Immunization History  Administered Date(s) Administered   Tdap 06/26/2008     ROS: See pertinent positives and negatives per HPI.  Past Medical History:  Diagnosis Date   Chicken pox    GERD (gastroesophageal reflux disease)    History of ovarian cyst     treated woth OCPS   Hx SBO    Infertility of tubal origin    hx of blocked tube    UTI (urinary tract infection)     Past Surgical History:  Procedure Laterality Date   NASAL SEPTUM SURGERY  12/24/11     Current Outpatient Medications:    albuterol (PROAIR HFA) 108 (90 Base) MCG/ACT inhaler, Inhale 2 puffs into the lungs every 6 (six) hours as needed for wheezing or shortness of breath., Disp: 1 each, Rfl: 0   benzonatate (TESSALON PERLES) 100 MG capsule, 1-2 capsules up to twice daily as needed for cough., Disp: 30 capsule, Rfl: 0  EXAM:  VITALS per patient if applicable: temp 99  GENERAL: alert, oriented, appears well and in no acute  distress  HEENT: atraumatic, conjunttiva clear, no obvious abnormalities on inspection of external nose and ears  NECK: normal movements of the head and neck  LUNGS: on inspection no signs of respiratory distress, breathing rate appears normal, no obvious gross SOB, gasping or wheezing  CV: no obvious cyanosis  MS: moves all visible extremities without noticeable abnormality  PSYCH/NEURO: pleasant and cooperative, no obvious depression or anxiety, speech and thought processing grossly intact  ASSESSMENT AND PLAN:  Discussed the following assessment and plan:  Exposure to the flu  Cough, unspecified type  Nasal congestion  Wheeze  -we discussed possible serious and likely etiologies, options for evaluation and workup, limitations of telemedicine visit vs in person visit, treatment, treatment risks and precautions. Pt is agreeable to treatment via telemedicine at this moment. Query influenza, vs other viral illness vs other. Advised covid testing since also is going around. Opted to try cough Rx (tessalon), alb 2 puff q6 hours prn and symptomatic care per pt instructions.  Work/School slipped offered: declined Advised to seek prompt virtual visit or in person care if worsening, new symptoms arise, or if is not improving with treatment as expected per our conversation of expected course. Discussed options for follow up care. Did let this patient know that I do telemedicine on Tuesdays and Thursdays for [place] and those are the days I am logged into the system. Advised to schedule follow up visit with PCP, [facility] virtual visits or [facility] if any further questions or concerns to avoid delays in  care.   I discussed the assessment and treatment plan with the patient. The patient was provided an opportunity to ask questions and all were answered. The patient agreed with the plan and demonstrated an understanding of the instructions.     Lucretia Kern, DO

## 2022-06-28 ENCOUNTER — Ambulatory Visit (INDEPENDENT_AMBULATORY_CARE_PROVIDER_SITE_OTHER): Payer: BC Managed Care – PPO

## 2022-06-28 ENCOUNTER — Encounter: Payer: Self-pay | Admitting: Family Medicine

## 2022-06-28 ENCOUNTER — Ambulatory Visit (INDEPENDENT_AMBULATORY_CARE_PROVIDER_SITE_OTHER): Payer: BC Managed Care – PPO | Admitting: Family Medicine

## 2022-06-28 VITALS — BP 118/88 | HR 117 | Temp 98.5°F | Ht 62.0 in | Wt 170.8 lb

## 2022-06-28 DIAGNOSIS — R051 Acute cough: Secondary | ICD-10-CM | POA: Diagnosis not present

## 2022-06-28 DIAGNOSIS — J189 Pneumonia, unspecified organism: Secondary | ICD-10-CM | POA: Diagnosis not present

## 2022-06-28 DIAGNOSIS — J209 Acute bronchitis, unspecified: Secondary | ICD-10-CM

## 2022-06-28 DIAGNOSIS — J159 Unspecified bacterial pneumonia: Secondary | ICD-10-CM

## 2022-06-28 LAB — POC COVID19 BINAXNOW: SARS Coronavirus 2 Ag: NEGATIVE

## 2022-06-28 MED ORDER — DOXYCYCLINE HYCLATE 100 MG PO TABS: 100.0000 mg | ORAL_TABLET | Freq: Two times a day (BID) | ORAL | 0 refills | Status: AC

## 2022-06-28 MED ORDER — PREDNISONE 20 MG PO TABS: ORAL_TABLET | ORAL | 0 refills | Status: AC

## 2022-06-28 NOTE — Progress Notes (Signed)
Established Patient Office Visit  Subjective   Patient ID: Julie Daniel, female    DOB: [birth year]  Age: 42 y.o. MRN: [id number]  Chief Complaint  Patient presents with   Cough    Productive with green and yellow sputum x5 days, taking Benzonatate with no relief   Fever    Temperature of 102.5 degrees last night   Diarrhea    X3 days   Emesis    X3 days    Pt reports her son was diagnosed with Flu A 6 days ago, then pt started having symptoms 5 days ago. Pt reports sinus congestion, coughing and fevers. States that she had a fever last night of 102. Pt is having diarrhea and vomiting intermittently as well. Pt reports.  Pt states that she was given the benzonatate capsules and an inhaler, but when it wears off her symptoms come right back.   Cough Associated symptoms include a fever.  Fever  Associated symptoms include coughing, diarrhea and vomiting.  Diarrhea  Associated symptoms include coughing, a fever and vomiting.  Emesis  Associated symptoms include coughing, diarrhea and a fever.      Review of Systems  Constitutional:  Positive for fever.  Respiratory:  Positive for cough.   Gastrointestinal:  Positive for diarrhea and vomiting.      Objective:     BP 118/88 (BP Location: Left Arm, Patient Position: Sitting, Cuff Size: Normal)   Pulse (!) 117   Temp 98.5 F (36.9 C) (Oral)   Ht 5\' 2"  (1.575 m)   Wt 170 lb 12.8 oz (77.5 kg)   LMP 07/10/2015 (Approximate)   SpO2 97%   BMI 31.24 kg/m    Physical Exam Vitals reviewed.  Constitutional:      Appearance: Normal appearance. She is normal weight.  HENT:     Nose: Congestion present.     Mouth/Throat:     Mouth: Mucous membranes are moist.     Pharynx: Posterior oropharyngeal erythema present.  Eyes:     Conjunctiva/sclera: Conjunctivae normal.  Cardiovascular:     Rate and Rhythm: Normal rate and regular rhythm.     Pulses: Normal pulses.  Pulmonary:     Effort: Pulmonary effort is normal.      Breath sounds: Wheezing present. No rhonchi or rales.  Musculoskeletal:     Cervical back: Normal range of motion.  Lymphadenopathy:     Cervical: No cervical adenopathy.  Neurological:     Mental Status: She is alert.      Results for orders placed or performed in visit on 06/28/22  POC COVID-19 BinaxNow  Result Value Ref Range   SARS Coronavirus 2 Ag Negative Negative        The ASCVD Risk score (Arnett DK, et al., 2019) failed to calculate for the following reasons:   The 2019 ASCVD risk score is only valid for ages 75 to 42    Assessment & Plan:   Problem List Items Addressed This Visit   None Visit Diagnoses     Acute cough    -  Primary   Relevant Orders   POC COVID-19 BinaxNow (Completed)   Acute bronchitis, unspecified organism       Relevant Medications   Inspiratory and expiratory wheezing in all lung fields, will treat with prednisone, starting with 60 mg daily and decreasing by 20 mg every 3 days until finished. Will also treat with doxycycline 100 mg BID for 7 days. Checking CXR today as well.  predniSONE (  DELTASONE) 20 MG tablet   doxycycline (VIBRA-TABS) 100 MG tablet   Other Relevant Orders   DG Chest 2 View       Return for needs regular appt with Dr. Elease Hashimoto.    Farrel Conners, MD

## 2022-06-29 ENCOUNTER — Telehealth: Payer: Self-pay | Admitting: Family Medicine

## 2022-06-29 NOTE — Telephone Encounter (Signed)
Pt called to go over her xray results CMA was unavailable. Pt asked that CMA call back at his earliest convenience.

## 2022-07-02 MED ORDER — AMOXICILLIN-POT CLAVULANATE 875-125 MG PO TABS: 1.0000 | ORAL_TABLET | Freq: Two times a day (BID) | ORAL | 0 refills | Status: AC

## 2022-07-02 NOTE — Telephone Encounter (Signed)
Please see result note 

## 2022-07-17 ENCOUNTER — Other Ambulatory Visit (HOSPITAL_COMMUNITY)
Admission: RE | Admit: 2022-07-17 | Discharge: 2022-07-17 | Disposition: A | Discharge status: Home / Self Care | Payer: BC Managed Care – PPO | Source: Ambulatory Visit | Attending: Family Medicine | Admitting: Family Medicine

## 2022-07-17 ENCOUNTER — Encounter: Payer: Self-pay | Admitting: Family Medicine

## 2022-07-17 ENCOUNTER — Ambulatory Visit (INDEPENDENT_AMBULATORY_CARE_PROVIDER_SITE_OTHER): Payer: BC Managed Care – PPO | Admitting: Family Medicine

## 2022-07-17 VITALS — BP 134/84 | HR 90 | Temp 97.8°F | Ht 61.81 in | Wt 172.8 lb

## 2022-07-17 DIAGNOSIS — R32 Unspecified urinary incontinence: Secondary | ICD-10-CM

## 2022-07-17 DIAGNOSIS — R140 Abdominal distension (gaseous): Secondary | ICD-10-CM | POA: Diagnosis not present

## 2022-07-17 DIAGNOSIS — Z0000 Encounter for general adult medical examination without abnormal findings: Secondary | ICD-10-CM

## 2022-07-17 DIAGNOSIS — Z1159 Encounter for screening for other viral diseases: Secondary | ICD-10-CM | POA: Diagnosis not present

## 2022-07-17 DIAGNOSIS — Z0001 Encounter for general adult medical examination with abnormal findings: Secondary | ICD-10-CM

## 2022-07-17 DIAGNOSIS — R102 Pelvic and perineal pain unspecified side: Secondary | ICD-10-CM

## 2022-07-17 DIAGNOSIS — J189 Pneumonia, unspecified organism: Secondary | ICD-10-CM

## 2022-07-17 DIAGNOSIS — Z23 Encounter for immunization: Secondary | ICD-10-CM

## 2022-07-17 DIAGNOSIS — Z124 Encounter for screening for malignant neoplasm of cervix: Secondary | ICD-10-CM | POA: Diagnosis not present

## 2022-07-17 LAB — BASIC METABOLIC PANEL
BUN: 9 mg/dL (ref 6–23)
CO2: 25 mEq/L (ref 19–32)
Calcium: 9.2 mg/dL (ref 8.4–10.5)
Chloride: 103 mEq/L (ref 96–112)
Creatinine, Ser: 0.66 mg/dL (ref 0.40–1.20)
GFR: 110.15 mL/min (ref >60.00)
Glucose, Bld: 94 mg/dL (ref 70–99)
Potassium: 3.7 mEq/L (ref 3.5–5.1)
Sodium: 139 mEq/L (ref 135–145)

## 2022-07-17 LAB — CBC WITH DIFFERENTIAL/PLATELET
Basophils Absolute: 0.1 10*3/uL (ref 0.0–0.1)
Basophils Relative: 0.9 % (ref 0.0–3.0)
Eosinophils Absolute: 0.2 10*3/uL (ref 0.0–0.7)
Eosinophils Relative: 2.1 % (ref 0.0–5.0)
HCT: 38.3 % (ref 36.0–46.0)
Hemoglobin: 13 g/dL (ref 12.0–15.0)
Lymphocytes Relative: 24.1 % (ref 12.0–46.0)
Lymphs Abs: 2 10*3/uL (ref 0.7–4.0)
MCHC: 33.9 g/dL (ref 30.0–36.0)
MCV: 94.5 fl (ref 78.0–100.0)
Monocytes Absolute: 0.4 10*3/uL (ref 0.1–1.0)
Monocytes Relative: 4.2 % (ref 3.0–12.0)
Neutro Abs: 5.8 10*3/uL (ref 1.4–7.7)
Neutrophils Relative %: 68.7 % (ref 43.0–77.0)
Platelets: 341 10*3/uL (ref 150.0–400.0)
RBC: 4.05 Mil/uL (ref 3.87–5.11)
RDW: 12.6 % (ref 11.5–15.5)
WBC: 8.4 10*3/uL (ref 4.0–10.5)

## 2022-07-17 LAB — URINALYSIS
Bilirubin Urine: NEGATIVE
Hgb urine dipstick: NEGATIVE
Ketones, ur: NEGATIVE
Leukocytes,Ua: NEGATIVE
Nitrite: NEGATIVE
Specific Gravity, Urine: 1.025 (ref 1.000–1.030)
Total Protein, Urine: NEGATIVE
Urine Glucose: NEGATIVE
Urobilinogen, UA: 1 (ref 0.0–1.0)
pH: 6 (ref 5.0–8.0)

## 2022-07-17 LAB — LIPID PANEL
Cholesterol: 163 mg/dL (ref 0–200)
HDL: 35.1 mg/dL — ABNORMAL LOW (ref >39.00)
LDL Cholesterol: 108 mg/dL — ABNORMAL HIGH (ref 0–99)
NonHDL: 128.03
Total CHOL/HDL Ratio: 5
Triglycerides: 101 mg/dL (ref 0.0–149.0)
VLDL: 20.2 mg/dL (ref 0.0–40.0)

## 2022-07-17 LAB — HEPATIC FUNCTION PANEL
ALT: 11 U/L (ref 0–35)
AST: 11 U/L (ref 0–37)
Albumin: 4.1 g/dL (ref 3.5–5.2)
Alkaline Phosphatase: 81 U/L (ref 39–117)
Bilirubin, Direct: 0 mg/dL (ref 0.0–0.3)
Total Bilirubin: 0.3 mg/dL (ref 0.2–1.2)
Total Protein: 8.1 g/dL (ref 6.0–8.3)

## 2022-07-17 LAB — TSH: TSH: 3.51 u[IU]/mL (ref 0.35–5.50)

## 2022-07-17 NOTE — Patient Instructions (Signed)
Go for follow-up chest x-ray at [facility] facility Monday through Friday 8 to 5 PM to follow-up from recent right perihilar pneumonia  Strongly advised to stop smoking  I will be setting up pelvic ultrasound to further assess and let me know if you have not had a call about that within the next couple weeks  We will call you regarding all the lab work done today

## 2022-07-17 NOTE — Addendum Note (Signed)
Addended by: Nilda Riggs on: 07/17/2022 09:42 AM   Modules accepted: Orders

## 2022-07-17 NOTE — Progress Notes (Signed)
Established Patient Office Visit  Subjective   Patient ID: Julie Daniel, female    DOB: 1980/11/26  Age: 42 y.o. MRN: [id number]  Chief Complaint  Patient presents with   Annual Exam    HPI   Julie Daniel is seen for physical exam-and for other medical issues as below.  She had recent cough and x-ray showed question of right perihilar pneumonia.  She is some better.  No hemoptysis.  Still has some lingering cough.  She does smoke about half pack cigarettes per day.  She states she had some leakage of urine recently with things like coughing.  She has 2 children ages 21 and 83 but these were not biological.  No history of vaginal delivery.  Her Pap smear is overdue.  Last tetanus over 10 years ago.  Declines flu vaccine.  She does relate frequent lower abdominal bloating.  Frequently only has about 2 bowel movements per week.  No bloody stools.  She has not had a period now in a few years but had previous labs that showed likely premature ovarian failure  She takes no regular medications.  -She is single.  2 children ages 49 and 9 and these were adopted.  Smokes half pack cigarette per day.  No alcohol.  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  Family history-mother has hypertension.  Father has history of colon cancer sometime in the 73s or 16s.  Past Medical History:  Diagnosis Date   Chicken pox    GERD (gastroesophageal reflux disease)    History of ovarian cyst     treated woth OCPS   Hx SBO    Infertility of tubal origin    hx of blocked tube    UTI (urinary tract infection)    Past Surgical History:  Procedure Laterality Date   NASAL SEPTUM SURGERY  12/24/11    reports that she has been smoking cigarettes. She has a 5.00 pack-year smoking history. She has never used smokeless tobacco. She reports that she does not currently use drugs after having used the following drugs: Marijuana. She reports that she does not drink alcohol. family history  includes Cancer in her maternal grandfather; Diabetes in her maternal grandmother; Hypertension in her mother; Sudden death (age of onset: 38) in her brother. No Known Allergies '  Review of Systems  Constitutional:  Negative for chills, fever, malaise/fatigue and weight loss.  HENT:  Negative for hearing loss.   Eyes:  Negative for blurred vision and double vision.  Respiratory:  Negative for cough and shortness of breath.   Cardiovascular:  Negative for chest pain, palpitations and leg swelling.  Gastrointestinal:  Negative for abdominal pain, blood in stool, constipation, diarrhea, nausea and vomiting.  Genitourinary:  Positive for frequency and urgency. Negative for dysuria and hematuria.  Skin:  Negative for rash.  Neurological:  Negative for dizziness, speech change, seizures, loss of consciousness and headaches.  Psychiatric/Behavioral:  Negative for depression.       Objective:     BP 134/84 (BP Location: Left Arm, Patient Position: Sitting, Cuff Size: Normal)   Pulse 90   Temp 97.8 F (36.6 C) (Oral)   Ht 5' 1.81" (1.57 m)   Wt 172 lb 12.8 oz (78.4 kg)   LMP 07/10/2015 (Approximate)   SpO2 99%   BMI 31.80 kg/m  BP Readings from Last 3 Encounters:  07/17/22 134/84  06/28/22 118/88  04/22/21 (!) 130/99   Wt Readings from  Last 3 Encounters:  07/17/22 172 lb 12.8 oz (78.4 kg)  06/28/22 170 lb 12.8 oz (77.5 kg)  06/26/22 170 lb (77.1 kg)      Physical Exam Vitals reviewed.  Constitutional:      General: She is not in acute distress. HENT:     Right Ear: Tympanic membrane normal.     Left Ear: Tympanic membrane normal.  Eyes:     Pupils: Pupils are equal, round, and reactive to light.  Cardiovascular:     Rate and Rhythm: Normal rate and regular rhythm.     Heart sounds: No murmur heard.    No gallop.  Pulmonary:     Effort: Pulmonary effort is normal.     Breath sounds: Normal breath sounds. No wheezing or rales.  Abdominal:     Palpations: Abdomen is  soft.     Tenderness: There is no abdominal tenderness. There is no guarding or rebound.  Genitourinary:    Comments: Normal external genitalia.  Vaginal mucosa appears normal.  Cervix normal in appearance.  Pap smear obtained.  Bimanual reveals some right adnexal fullness and slight tenderness to palpation. Musculoskeletal:     Cervical back: Neck supple.     Right lower leg: No edema.     Left lower leg: No edema.  Lymphadenopathy:     Cervical: No cervical adenopathy.  Skin:    Findings: No rash.  Neurological:     General: No focal deficit present.  Psychiatric:        Mood and Affect: Mood normal.      No results found for any visits on 07/17/22.    The ASCVD Risk score (Arnett DK, et al., 2019) failed to calculate for the following reasons:   The 2019 ASCVD risk score is only valid for ages 56 to 45    Assessment & Plan:   #1 physical exam.  We discussed several health maintenance issues as below -Strongly encouraged to stop smoking.  Handout given. -Pap smear obtained today -Consider setting up initial mammogram this year -Flu vaccine offered but declined -Check labs including hepatitis C antibody -Tetanus booster given  #2 recent cough with concern for possible right perihilar pneumonia.  Given her smoking status recommend follow-up chest x-ray to document clearing.  This will be ordered to get sometime later this week.    #3 lower abdominal bloating.  She is also describing some urine incontinence and no obvious cystocele on exam.  She does have some right adnexal tenderness on exam today and some right adnexal fullness.  Recommend pelvic ultrasound to further assess.  Carolann Littler, MD

## 2022-07-18 LAB — HEPATITIS C ANTIBODY: Hepatitis C Ab: NONREACTIVE

## 2022-07-20 LAB — CYTOLOGY - PAP: Diagnosis: NEGATIVE

## 2022-07-26 ENCOUNTER — Ambulatory Visit (HOSPITAL_BASED_OUTPATIENT_CLINIC_OR_DEPARTMENT_OTHER)
Admission: RE | Admit: 2022-07-26 | Discharge: 2022-07-26 | Disposition: A | Discharge status: Home / Self Care | Payer: BC Managed Care – PPO | Source: Ambulatory Visit | Attending: Family Medicine | Admitting: Family Medicine

## 2022-07-26 DIAGNOSIS — R102 Pelvic and perineal pain: Secondary | ICD-10-CM | POA: Insufficient documentation

## 2022-07-26 DIAGNOSIS — J189 Pneumonia, unspecified organism: Secondary | ICD-10-CM | POA: Insufficient documentation

## 2022-07-26 DIAGNOSIS — Z8709 Personal history of other diseases of the respiratory system: Secondary | ICD-10-CM | POA: Diagnosis not present

## 2022-07-26 DIAGNOSIS — R140 Abdominal distension (gaseous): Secondary | ICD-10-CM | POA: Diagnosis not present

## 2022-07-26 DIAGNOSIS — R1030 Lower abdominal pain, unspecified: Secondary | ICD-10-CM | POA: Diagnosis not present

## 2022-07-27 ENCOUNTER — Other Ambulatory Visit: Payer: Self-pay

## 2023-04-13 ENCOUNTER — Emergency Department (HOSPITAL_BASED_OUTPATIENT_CLINIC_OR_DEPARTMENT_OTHER)
Admission: EM | Admit: 2023-04-13 | Discharge: 2023-04-13 | Disposition: A | Discharge status: Home / Self Care | Payer: BC Managed Care – PPO | Attending: Emergency Medicine | Admitting: Emergency Medicine

## 2023-04-13 ENCOUNTER — Emergency Department (HOSPITAL_BASED_OUTPATIENT_CLINIC_OR_DEPARTMENT_OTHER): Payer: BC Managed Care – PPO | Admitting: Radiology

## 2023-04-13 ENCOUNTER — Encounter (HOSPITAL_BASED_OUTPATIENT_CLINIC_OR_DEPARTMENT_OTHER): Payer: Self-pay

## 2023-04-13 DIAGNOSIS — R059 Cough, unspecified: Secondary | ICD-10-CM | POA: Diagnosis not present

## 2023-04-13 DIAGNOSIS — J40 Bronchitis, not specified as acute or chronic: Secondary | ICD-10-CM | POA: Diagnosis not present

## 2023-04-13 DIAGNOSIS — J9811 Atelectasis: Secondary | ICD-10-CM | POA: Diagnosis not present

## 2023-04-13 DIAGNOSIS — J320 Chronic maxillary sinusitis: Secondary | ICD-10-CM | POA: Diagnosis not present

## 2023-04-13 DIAGNOSIS — R0981 Nasal congestion: Secondary | ICD-10-CM | POA: Diagnosis not present

## 2023-04-13 DIAGNOSIS — J329 Chronic sinusitis, unspecified: Secondary | ICD-10-CM | POA: Diagnosis not present

## 2023-04-13 DIAGNOSIS — R0989 Other specified symptoms and signs involving the circulatory and respiratory systems: Secondary | ICD-10-CM | POA: Diagnosis not present

## 2023-04-13 LAB — CBC WITH DIFFERENTIAL/PLATELET
Abs Immature Granulocytes: 0.02 10*3/uL (ref 0.00–0.07)
Basophils Absolute: 0.1 10*3/uL (ref 0.0–0.1)
Basophils Relative: 1 %
Eosinophils Absolute: 0.2 10*3/uL (ref 0.0–0.5)
Eosinophils Relative: 3 %
HCT: 39.2 % (ref 36.0–46.0)
Hemoglobin: 12.7 g/dL (ref 12.0–15.0)
Immature Granulocytes: 0 %
Lymphocytes Relative: 31 %
Lymphs Abs: 2.8 10*3/uL (ref 0.7–4.0)
MCH: 31.3 pg (ref 26.0–34.0)
MCHC: 32.4 g/dL (ref 30.0–36.0)
MCV: 96.6 fL (ref 80.0–100.0)
Monocytes Absolute: 0.4 10*3/uL (ref 0.1–1.0)
Monocytes Relative: 5 %
Neutro Abs: 5.3 10*3/uL (ref 1.7–7.7)
Neutrophils Relative %: 60 %
Platelets: 289 10*3/uL (ref 150–400)
RBC: 4.06 MIL/uL (ref 3.87–5.11)
RDW: 13.1 % (ref 11.5–15.5)
WBC: 8.8 10*3/uL (ref 4.0–10.5)
nRBC: 0 % (ref 0.0–0.2)

## 2023-04-13 LAB — COMPREHENSIVE METABOLIC PANEL
ALT: 13 U/L (ref 0–44)
AST: 12 U/L — ABNORMAL LOW (ref 15–41)
Albumin: 4.2 g/dL (ref 3.5–5.0)
Alkaline Phosphatase: 71 U/L (ref 38–126)
Anion gap: 9 (ref 5–15)
BUN: 12 mg/dL (ref 6–20)
CO2: 28 mmol/L (ref 22–32)
Calcium: 9.4 mg/dL (ref 8.9–10.3)
Chloride: 104 mmol/L (ref 98–111)
Creatinine, Ser: 0.76 mg/dL (ref 0.44–1.00)
GFR, Estimated: >60 mL/min (ref >60)
Glucose, Bld: 97 mg/dL (ref 70–99)
Potassium: 3.4 mmol/L — ABNORMAL LOW (ref 3.5–5.1)
Sodium: 141 mmol/L (ref 135–145)
Total Bilirubin: 0.3 mg/dL (ref 0.3–1.2)
Total Protein: 7.5 g/dL (ref 6.5–8.1)

## 2023-04-13 LAB — TROPONIN I (HIGH SENSITIVITY)
Troponin I (High Sensitivity): <2 ng/L (ref <18)
Troponin I (High Sensitivity): <2 ng/L (ref <18)

## 2023-04-13 LAB — HCG, SERUM, QUALITATIVE: Preg, Serum: NEGATIVE

## 2023-04-13 MED ORDER — BENZONATATE 100 MG PO CAPS: 100.0000 mg | ORAL_CAPSULE | Freq: Three times a day (TID) | ORAL | 0 refills | Status: DC | PRN

## 2023-04-13 MED ORDER — MAGNESIUM SULFATE 2 GM/50ML IV SOLN
2.0000 g | Freq: Once | INTRAVENOUS | Status: DC
  Filled 2023-04-13: qty 50

## 2023-04-13 MED ORDER — AMOXICILLIN-POT CLAVULANATE 875-125 MG PO TABS: 1.0000 | ORAL_TABLET | Freq: Two times a day (BID) | ORAL | 0 refills | Status: DC

## 2023-04-13 MED ORDER — METHYLPREDNISOLONE SODIUM SUCC 125 MG IJ SOLR
125.0000 mg | Freq: Once | INTRAMUSCULAR | Status: AC
  Administered 2023-04-13: 125 mg via INTRAVENOUS
  Filled 2023-04-13: qty 2

## 2023-04-13 MED ORDER — ALBUTEROL SULFATE (2.5 MG/3ML) 0.083% IN NEBU: 10.0000 mg | INHALATION_SOLUTION | Freq: Once | RESPIRATORY_TRACT | Status: AC

## 2023-04-13 MED ORDER — PREDNISONE 20 MG PO TABS: 40.0000 mg | ORAL_TABLET | Freq: Every day | ORAL | 0 refills | Status: AC

## 2023-04-13 MED ORDER — ALBUTEROL SULFATE (2.5 MG/3ML) 0.083% IN NEBU
2.5000 mg | INHALATION_SOLUTION | Freq: Once | RESPIRATORY_TRACT | Status: AC
  Administered 2023-04-13: 2.5 mg via RESPIRATORY_TRACT

## 2023-04-13 MED ORDER — ALBUTEROL SULFATE HFA 108 (90 BASE) MCG/ACT IN AERS
2.0000 | INHALATION_SPRAY | RESPIRATORY_TRACT | Status: DC | PRN
  Administered 2023-04-13: 2 via RESPIRATORY_TRACT
  Filled 2023-04-13: qty 6.7

## 2023-04-13 MED ORDER — ALBUTEROL SULFATE (2.5 MG/3ML) 0.083% IN NEBU
INHALATION_SOLUTION | RESPIRATORY_TRACT | Status: AC
  Administered 2023-04-13: 10 mg via RESPIRATORY_TRACT
  Filled 2023-04-13: qty 6

## 2023-04-13 MED ORDER — FLUTICASONE PROPIONATE 50 MCG/ACT NA SUSP: 1.0000 | Freq: Every day | NASAL | 2 refills | Status: DC

## 2023-04-13 MED ORDER — MAGNESIUM SULFATE 2 GM/50ML IV SOLN
2.0000 g | Freq: Once | INTRAVENOUS | Status: AC
  Administered 2023-04-13: 2 g via INTRAVENOUS

## 2023-04-13 MED ORDER — BENZONATATE 100 MG PO CAPS
100.0000 mg | ORAL_CAPSULE | Freq: Once | ORAL | Status: AC
  Administered 2023-04-13: 100 mg via ORAL
  Filled 2023-04-13: qty 1

## 2023-04-13 MED ORDER — ALBUTEROL SULFATE (2.5 MG/3ML) 0.083% IN NEBU
INHALATION_SOLUTION | RESPIRATORY_TRACT | Status: AC
  Filled 2023-04-13: qty 12

## 2023-04-13 MED ORDER — IPRATROPIUM-ALBUTEROL 0.5-2.5 (3) MG/3ML IN SOLN
3.0000 mL | RESPIRATORY_TRACT | Status: AC
  Administered 2023-04-13: 3 mL via RESPIRATORY_TRACT
  Filled 2023-04-13: qty 3

## 2023-04-13 MED ORDER — IPRATROPIUM BROMIDE 0.02 % IN SOLN
1.0000 mg | Freq: Once | RESPIRATORY_TRACT | Status: AC
  Administered 2023-04-13: 1 mg via RESPIRATORY_TRACT

## 2023-04-13 MED ORDER — IPRATROPIUM BROMIDE 0.02 % IN SOLN
RESPIRATORY_TRACT | Status: AC
  Filled 2023-04-13: qty 5

## 2023-04-13 MED ORDER — ACETAMINOPHEN 500 MG PO TABS
1000.0000 mg | ORAL_TABLET | Freq: Once | ORAL | Status: AC
  Administered 2023-04-13: 1000 mg via ORAL
  Filled 2023-04-13: qty 2

## 2023-04-13 NOTE — Progress Notes (Signed)
RT ambulated the pt on RA SATS 93-97%, HR 103. The Pt did have some work of breathing

## 2023-04-13 NOTE — ED Notes (Signed)
Discharge paperwork given and verbally understood. 

## 2023-04-13 NOTE — ED Provider Notes (Signed)
[place] [facility] DEPARTMENT AT [facility] Provider Note   CSN: [id number] Arrival date & time: 04/13/23  0102     History {Add pertinent medical, surgical, social history, OB history to HPI:1} Chief Complaint  Patient presents with   URI    Julie Daniel is a 42 y.o. female.   URI      Home Medications Prior to Admission medications   Medication Sig Start Date End Date Taking? Authorizing Provider  albuterol (PROAIR HFA) 108 (90 Base) MCG/ACT inhaler Inhale 2 puffs into the lungs every 6 (six) hours as needed for wheezing or shortness of breath. 06/26/22   Terressa Koyanagi, DO  benzonatate (TESSALON PERLES) 100 MG capsule 1-2 capsules up to twice daily as needed for cough. 06/26/22   Terressa Koyanagi, DO      Allergies    Patient has no known allergies.    Review of Systems   Review of Systems  Physical Exam Updated Vital Signs BP (!) 140/88   Pulse 100   Temp 98.4 F (36.9 C)   Resp (!) 23   LMP 07/10/2015 (Approximate)   SpO2 100%  Physical Exam  ED Results / Procedures / Treatments   Labs (all labs ordered are listed, but only abnormal results are displayed) Labs Reviewed  COMPREHENSIVE METABOLIC PANEL - Abnormal; Notable for the following components:      Result Value   Potassium 3.4 (*)    AST 12 (*)    All other components within normal limits  CBC WITH DIFFERENTIAL/PLATELET  HCG, SERUM, QUALITATIVE  TROPONIN I (HIGH SENSITIVITY)  TROPONIN I (HIGH SENSITIVITY)    EKG None  Radiology DG Chest 2 View  Result Date: 04/13/2023 CLINICAL DATA:  Cough, congestion. EXAM: CHEST - 2 VIEW COMPARISON:  Chest radiograph dated 07/26/2022. FINDINGS: The heart size and mediastinal contours are within normal limits. Mild left basilar atelectasis. The right lung is clear. No pleural effusion or pneumothorax. The visualized skeletal structures are unremarkable. IMPRESSION: Mild left basilar atelectasis. Electronically Signed   By: Romona Curls M.D.    On: 04/13/2023 11:44    Procedures Procedures  {Document cardiac monitor, telemetry assessment procedure when appropriate:1}  Medications Ordered in ED Medications  albuterol (VENTOLIN HFA) 108 (90 Base) MCG/ACT inhaler 2 puff (2 puffs Inhalation Given 04/13/23 1304)  albuterol (PROVENTIL) (2.5 MG/3ML) 0.083% nebulizer solution (  Not Given 04/13/23 1142)  methylPREDNISolone sodium succinate (SOLU-MEDROL) 125 mg/2 mL injection 125 mg (125 mg Intravenous Given 04/13/23 1010)  ipratropium-albuterol (DUONEB) 0.5-2.5 (3) MG/3ML nebulizer solution 3 mL (3 mLs Nebulization Given 04/13/23 1014)  acetaminophen (TYLENOL) tablet 1,000 mg (1,000 mg Oral Given 04/13/23 1056)  albuterol (PROVENTIL) (2.5 MG/3ML) 0.083% nebulizer solution 2.5 mg (2.5 mg Nebulization Given 04/13/23 1100)  albuterol (PROVENTIL) (2.5 MG/3ML) 0.083% nebulizer solution 2.5 mg (2.5 mg Nebulization Given 04/13/23 1059)  albuterol (PROVENTIL) (2.5 MG/3ML) 0.083% nebulizer solution 10 mg (10 mg Nebulization Given 04/13/23 1139)  ipratropium (ATROVENT) nebulizer solution 1 mg (1 mg Nebulization Not Given 04/13/23 1141)  magnesium sulfate IVPB 2 g 50 mL (0 g Intravenous Stopped 04/13/23 1301)    ED Course/ Medical Decision Making/ A&P   {   Click here for ABCD2, HEART and other calculatorsREFRESH Note before signing :1}                              Medical Decision Making Amount and/or Complexity of Data Reviewed Labs: ordered. Radiology:  ordered.  Risk OTC drugs. Prescription drug management.   ***  {Document critical care time when appropriate:1} {Document review of labs and clinical decision tools ie heart score, Chads2Vasc2 etc:1}  {Document your independent review of radiology images, and any outside records:1} {Document your discussion with family members, caretakers, and with consultants:1} {Document social determinants of health affecting pt's care:1} {Document your decision making why or why not admission,  treatments were needed:1} Final Clinical Impression(s) / ED Diagnoses Final diagnoses:  None    Rx / DC Orders ED Discharge Orders     None

## 2023-04-13 NOTE — Progress Notes (Signed)
Pt has more air movement. The Pt's wheezing is better and she has some rhonchi

## 2023-04-13 NOTE — Discharge Instructions (Addendum)
You were seen in the ER for evaluation of your cough and cold symptoms. I likely think that you have bronchitis with some sinusitis. Please make sure you are taking the medications as prescribed. You can use your albuterol inhaler 1-2 puffs every 4-6 hours as needed. This can help with your cough. You can take tessalon pearls for coughing, or you can try Robitussin.  You can also try things to keep your throat well-lubricated such as cough drops or lozenges.  I also recommend cool-mist humidifier's.  Please make sure that you follow up with your primary care provider closely to make sure this doesn't turn into pneumonia. If you have any concerns, new or worsening symptoms, please return to the nearest ER for re-evaluation.   Contact a doctor if: Your symptoms do not get better in 2 weeks. You have trouble coughing up the mucus. Your cough keeps you awake at night. You have a fever. Get help right away if: You cough up blood. You have chest pain. You have very bad shortness of breath. You faint or keep feeling like you are going to faint. You have a very bad headache. Your fever or chills get worse. These symptoms may be an emergency. Get help right away. Call your local emergency services (911 in the [place] Do not wait to see if the symptoms will go away. Do not drive yourself to the hospital.

## 2023-04-13 NOTE — ED Triage Notes (Signed)
She c/o congested cough x ~ 2 weeks. She also feels short of breath; and has been using her albuterol inhaler. She also mentions that her stools have been "thick liquid" x 2 weeks. She denies fever, and is in no distress.

## 2023-04-13 NOTE — Progress Notes (Signed)
RT assessed the Pt and her lungs sound a lot better. She still has a cough but the Pt said she had surgery on her nose and the drainage is harder to clear due to the surgery. The Pt was educated on inhaler with a mask spacer.

## 2023-05-20 ENCOUNTER — Ambulatory Visit (INDEPENDENT_AMBULATORY_CARE_PROVIDER_SITE_OTHER): Payer: BC Managed Care – PPO | Admitting: Family Medicine

## 2023-05-20 ENCOUNTER — Encounter: Payer: Self-pay | Admitting: Family Medicine

## 2023-05-20 VITALS — BP 138/98 | HR 82 | Temp 98.0°F | Wt 178.0 lb

## 2023-05-20 DIAGNOSIS — M722 Plantar fascial fibromatosis: Secondary | ICD-10-CM

## 2023-05-20 DIAGNOSIS — J029 Acute pharyngitis, unspecified: Secondary | ICD-10-CM | POA: Diagnosis not present

## 2023-05-20 DIAGNOSIS — R059 Cough, unspecified: Secondary | ICD-10-CM | POA: Diagnosis not present

## 2023-05-20 DIAGNOSIS — H66002 Acute suppurative otitis media without spontaneous rupture of ear drum, left ear: Secondary | ICD-10-CM

## 2023-05-20 DIAGNOSIS — R030 Elevated blood-pressure reading, without diagnosis of hypertension: Secondary | ICD-10-CM

## 2023-05-20 LAB — POCT RAPID STREP A (OFFICE): Rapid Strep A Screen: NEGATIVE

## 2023-05-20 LAB — POC COVID19 BINAXNOW: SARS Coronavirus 2 Ag: NEGATIVE

## 2023-05-20 MED ORDER — CEFDINIR 300 MG PO CAPS: 300.0000 mg | ORAL_CAPSULE | Freq: Two times a day (BID) | ORAL | 0 refills | Status: DC

## 2023-05-20 NOTE — Progress Notes (Signed)
Established Patient Office Visit  Subjective   Patient ID: Julie Daniel, female    DOB: 1981-04-03  Age: 42 y.o. MRN: [id number]  Chief Complaint  Patient presents with   Ear Pain    Patient complains of left ear pain,   Sore Throat    Patient complains of sore throat, x3 days     HPI   Julie Daniel is seen today for multiple complaints as follows  She relates left ear pain especially past few days.  Has also had some intermittent sore throat for a couple of weeks.  Strep testing and COVID testing negative here today.  She has had some decrease in hearing the left ear past few days.  Intermittent vertigo past couple of weeks but not currently.  Denies any fever.  Couple times she went to blow her nose and felt like her left ear stopped up worse  Had some leftover Augmentin and apparently has been taking that past couple days.  Also relates left heel pain.  This is near the attachment of the plantar fascia to the calcaneus.  Pain is especially bothersome after she sits at night after being on her feet all day.  When she first gets up she has pain.  Has not tried any significant stretching or icing.  She also relates frequent abdominal bloating that after just about any meal.  No diarrhea.  Cannot relate this to any 1 specific type of food.  No known history of lactose intolerance or celiac disease.  Appetite and weight stable.  No stool changes.  She has had some intermittent constipation in the past.  Ongoing nicotine use.  Elevated blood pressure today without history of hypertension.  Past Medical History:  Diagnosis Date   Chicken pox    GERD (gastroesophageal reflux disease)    History of ovarian cyst     treated woth OCPS   Hx SBO    Infertility of tubal origin    hx of blocked tube    UTI (urinary tract infection)    Past Surgical History:  Procedure Laterality Date   NASAL SEPTUM SURGERY  12/24/11    reports that she has been smoking cigarettes. She has a 5  pack-year smoking history. She has never used smokeless tobacco. She reports that she does not currently use drugs after having used the following drugs: Marijuana. She reports that she does not drink alcohol. family history includes Cancer in her maternal grandfather; Diabetes in her maternal grandmother; Hypertension in her mother; Sudden death (age of onset: 83) in her brother. No Known Allergies  Review of Systems  Constitutional:  Negative for chills, fever and weight loss.  HENT:  Positive for congestion, ear pain and sore throat. Negative for ear discharge and tinnitus.   Respiratory:  Negative for hemoptysis and shortness of breath.   Cardiovascular:  Negative for chest pain.  Genitourinary:  Positive for urgency. Negative for dysuria.      Objective:     BP (!) 138/98 (BP Location: Left Arm, Cuff Size: Normal)   Pulse 82   Temp 98 F (36.7 C) (Oral)   Wt 178 lb (80.7 kg)   LMP 07/10/2015 (Approximate)   SpO2 97%   BMI 32.76 kg/m  BP Readings from Last 3 Encounters:  05/20/23 (!) 138/98  04/13/23 126/71  07/17/22 134/84   Wt Readings from Last 3 Encounters:  05/20/23 178 lb (80.7 kg)  07/17/22 172 lb 12.8 oz (78.4 kg)  06/28/22 170 lb 12.8 oz (77.5  kg)      Physical Exam Vitals reviewed.  Constitutional:      General: She is not in acute distress.    Appearance: She is well-developed. She is not ill-appearing.  HENT:     Head: Normocephalic and atraumatic.     Ears:     Comments: Right TM is normal.  Left eardrum reveals suppurative changes along the posterior and inferior aspect.  Mild erythema.  No visible perforation.  Ear canal clear.    Mouth/Throat:     Comments: Moist.  Mild posterior pharynx erythema without exudate Pulmonary:     Effort: Pulmonary effort is normal.     Breath sounds: Normal breath sounds. No wheezing or rales.  Musculoskeletal:     Cervical back: Neck supple.     Comments: Left heel reveals tenderness over the attachment of the  plantar fascia to the calcaneus.  No visible swelling.  No erythema.  No Achilles tenderness  Lymphadenopathy:     Cervical: No cervical adenopathy.  Neurological:     Mental Status: She is alert.      Results for orders placed or performed in visit on 05/20/23  POC COVID-19  Result Value Ref Range   SARS Coronavirus 2 Ag Negative Negative  POC Rapid Strep A  Result Value Ref Range   Rapid Strep A Screen Negative Negative      The 10-year ASCVD risk score (Arnett DK, et al., 2019) is: 4.4%    Assessment & Plan:   #1 acute left suppurative otitis media.  Start Omnicef 300 mg twice daily for 10 days.  Set up 2-week follow-up to reassess  #2 elevated blood pressure without history of hypertension.  Recommend close home monitoring next couple weeks and write down readings.  Watch sodium intake.  If elevated at follow-up consider possibly initiating medication versus trial of weight loss  #3 left plantar fasciitis. -Recommend icing with couple of methods suggested -Consider insert for insole to help with support -Recommend frequent stretching of plantar fascia throughout the day -Reassess in 2 weeks.  If not improved at that point consider either podiatry referral or possibly steroid injection depending on her tolerance level with pain  #3 intermittent abdominal bloating.  Assess further at follow-up.  Consider further diagnostics including screening for gluten sensitivity   Return in about 2 weeks (around 06/03/2023).    Julie Peat, MD

## 2023-05-27 ENCOUNTER — Encounter: Payer: Self-pay | Admitting: Family Medicine

## 2023-05-27 ENCOUNTER — Ambulatory Visit (INDEPENDENT_AMBULATORY_CARE_PROVIDER_SITE_OTHER): Payer: BC Managed Care – PPO | Admitting: Family Medicine

## 2023-05-27 VITALS — BP 122/70 | HR 90 | Temp 98.3°F | Ht 61.0 in | Wt 178.3 lb

## 2023-05-27 DIAGNOSIS — H66002 Acute suppurative otitis media without spontaneous rupture of ear drum, left ear: Secondary | ICD-10-CM

## 2023-05-27 MED ORDER — PREDNISONE 10 MG PO TABS: ORAL_TABLET | ORAL | 0 refills | Status: DC

## 2023-05-27 NOTE — Progress Notes (Signed)
Established Patient Office Visit  Subjective   Patient ID: Julie Daniel, female    DOB: [birth year]  Age: 42 y.o. MRN: [id number]  Chief Complaint  Patient presents with   Hearing Loss    Patient complains of ongoing hearing loss in left ear    HPI   Julie Daniel was seen just a week ago with suppurative changes left ear.  We placed her on Omnicef and she has 1 more Julie left.  Having some decreased hearing in the left ear but no real pain.  No fever.  Still has some nasal congestive symptoms.  No right ear symptoms.  No vertigo symptoms.  Past Medical History:  Diagnosis Date   Chicken pox    GERD (gastroesophageal reflux disease)    History of ovarian cyst     treated woth OCPS   Hx SBO    Infertility of tubal origin    hx of blocked tube    UTI (urinary tract infection)    Past Surgical History:  Procedure Laterality Date   NASAL SEPTUM SURGERY  12/24/11    reports that she has been smoking cigarettes. She has a 5 pack-year smoking history. She has never used smokeless tobacco. She reports that she does not currently use drugs after having used the following drugs: Marijuana. She reports that she does not drink alcohol. family history includes Cancer in her maternal grandfather; Diabetes in her maternal grandmother; Hypertension in her mother; Sudden death (age of onset: 13) in her brother. No Known Allergies  Review of Systems  Constitutional:  Negative for chills and fever.  HENT:  Positive for hearing loss. Negative for ear pain.       Objective:     BP 122/70 (BP Location: Left Arm, Patient Position: Sitting, Cuff Size: Large)   Pulse 90   Temp 98.3 F (36.8 C) (Oral)   Ht 5\' 1"  (1.549 m)   Wt 178 lb 4.8 oz (80.9 kg)   LMP 07/10/2015 (Approximate)   SpO2 97%   BMI 33.69 kg/m  BP Readings from Last 3 Encounters:  05/27/23 122/70  05/20/23 (!) 138/98  04/13/23 126/71   Wt Readings from Last 3 Encounters:  05/27/23 178 lb 4.8 oz (80.9 kg)  05/20/23 178  lb (80.7 kg)  07/17/22 172 lb 12.8 oz (78.4 kg)      Physical Exam Vitals reviewed.  Constitutional:      General: She is not in acute distress.    Appearance: She is not ill-appearing or toxic-appearing.  HENT:     Ears:     Comments: Right eardrum appears normal.  Left reveals effusion.  She has resolution of erythema which had been noted last week.  Still has fairly large effusion on the left with no perforation Cardiovascular:     Rate and Rhythm: Normal rate and regular rhythm.      No results found for any visits on 05/27/23.    The 10-year ASCVD risk score (Arnett DK, et al., 2019) is: 3.4%    Assessment & Plan:   Acute left suppurative otitis media.  Patient finishing up course of Omnicef.  Overall, left TM looks better but still has fairly large effusion.  We explained this may take several weeks to resolve. -She is having significant ongoing allergy symptoms and we elected to go and add prednisone for the next week and finish out Omnicef. -If not seeing any improvement in hearing over the next few weeks consider ENT referral   [name],  MD

## 2023-05-27 NOTE — Patient Instructions (Signed)
Left ear is looking some better, but still has some fluid/effusion.   The effusion may take several weeks to fully resolve.

## 2023-06-03 ENCOUNTER — Ambulatory Visit: Payer: BC Managed Care – PPO | Admitting: Family Medicine

## 2023-06-21 IMAGING — CT CT ABD-PELV W/ CM
2 of 4 series · 17 of 46 positions shown, 19 images · IV contrast (APPLIED)
Comparison: None.

CLINICAL DATA: Abdominal abscess suspected.

EXAM:
CT ABDOMEN AND PELVIS WITH CONTRAST
TECHNIQUE: Multidetector CT imaging of the abdomen and pelvis was performed
using the standard protocol following bolus administration of
intravenous contrast.
CONTRAST:  85mL OMNIPAQUE IOHEXOL 350 MG/ML SOLN

[Series 2: abd pel w · axial · 0.73mm/px · z∈[+774,+1209]mm · 14 of 95 slices shown, 16 images]
[im 4/95  soft-tissue]
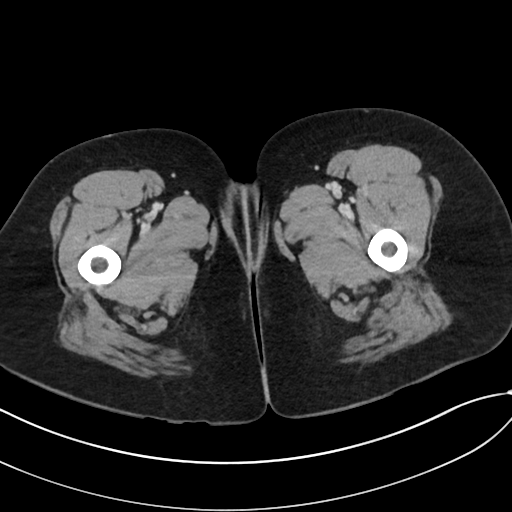
[im 4/95  bone]
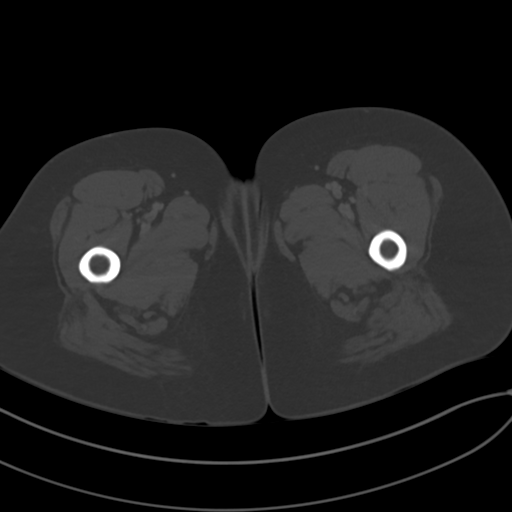
[im 12/95  soft-tissue]
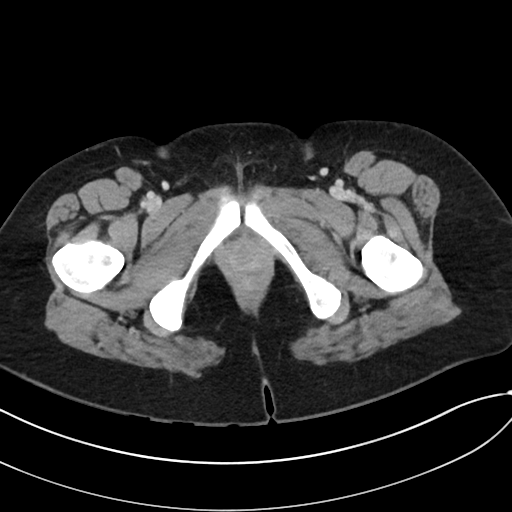
[im 19/95  soft-tissue]
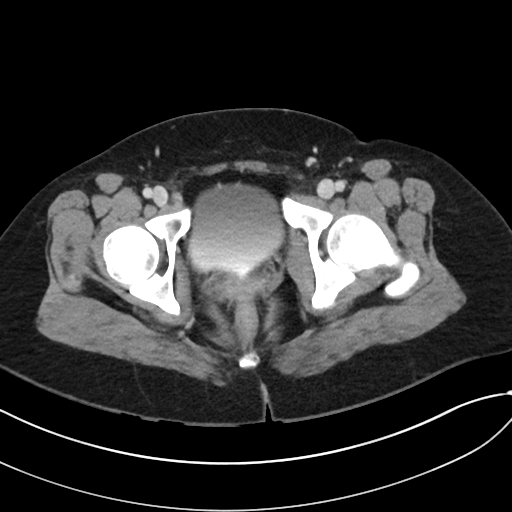
[im 27/95  soft-tissue]
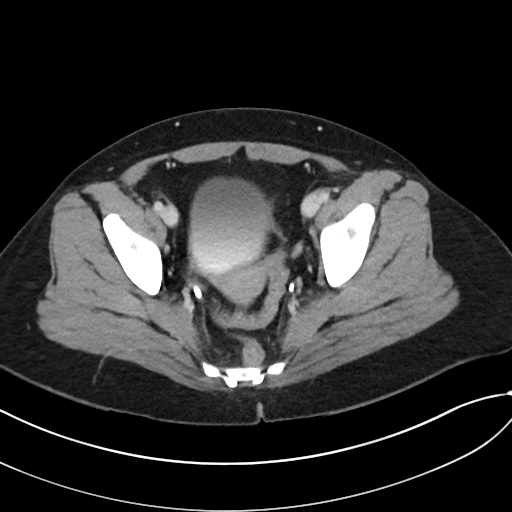
[im 31/95  soft-tissue]
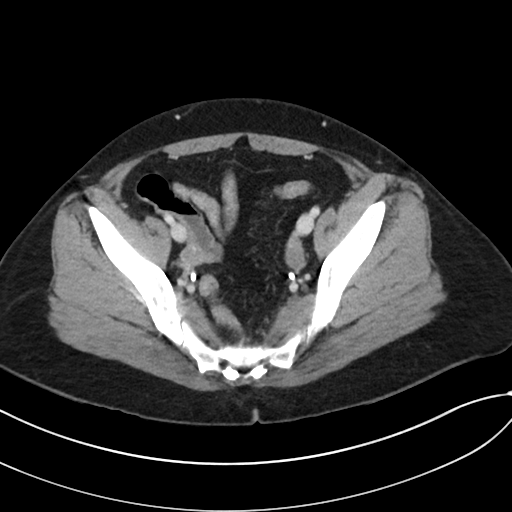
[im 38/95  soft-tissue]
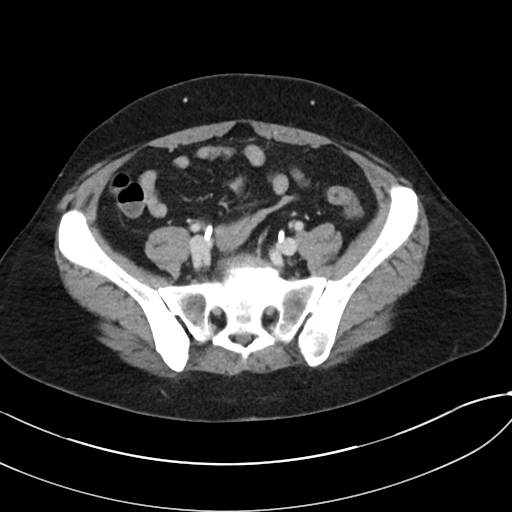
[im 46/95  soft-tissue]
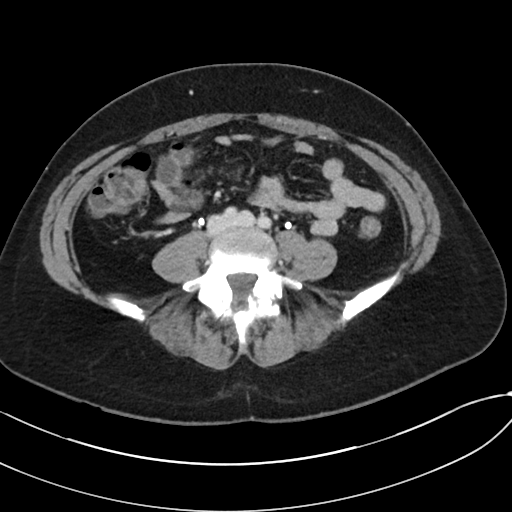
[im 49/95  soft-tissue]
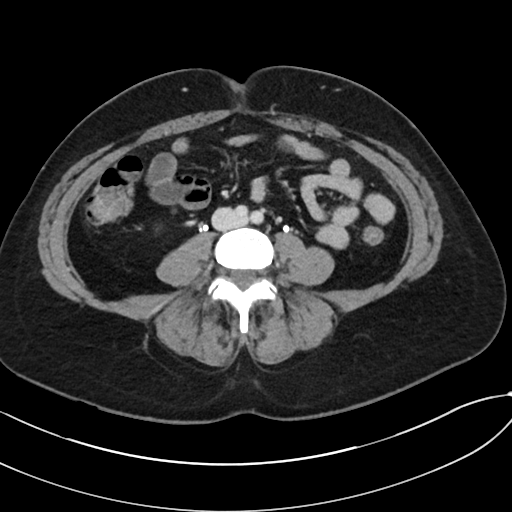
[im 57/95  soft-tissue]
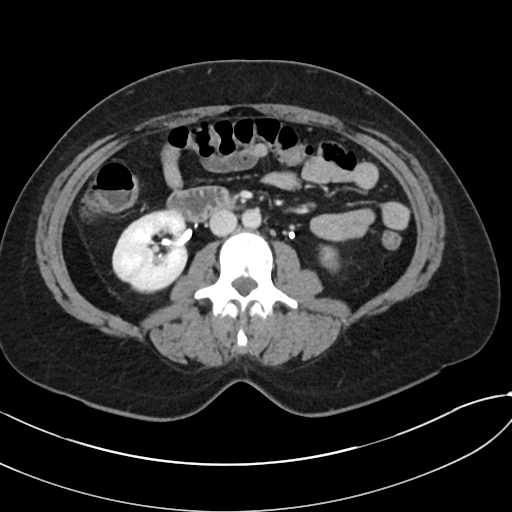
[im 57/95  bone]
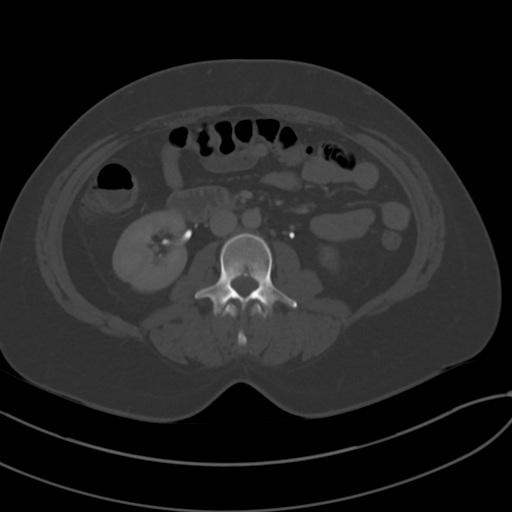
[im 64/95  soft-tissue]
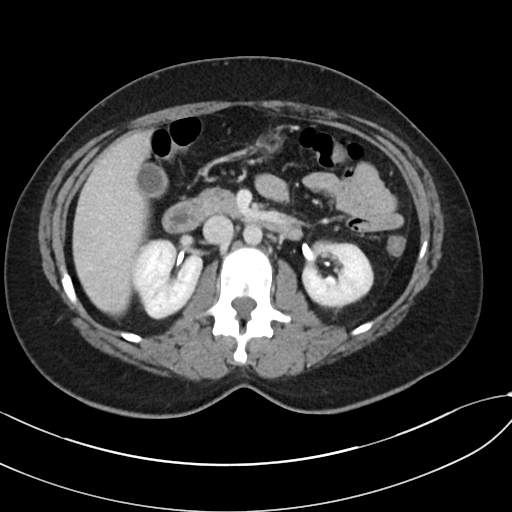
[im 72/95  soft-tissue]
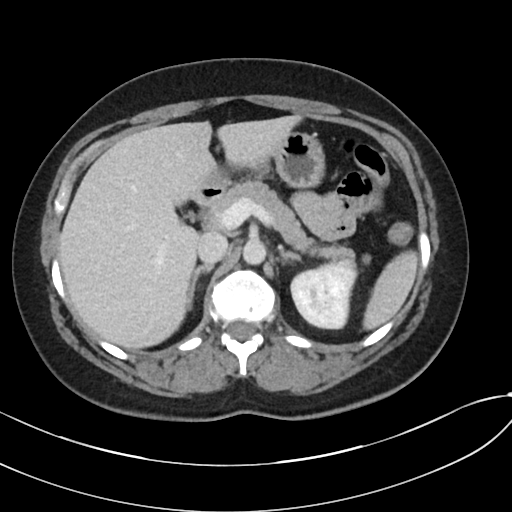
[im 76/95  soft-tissue]
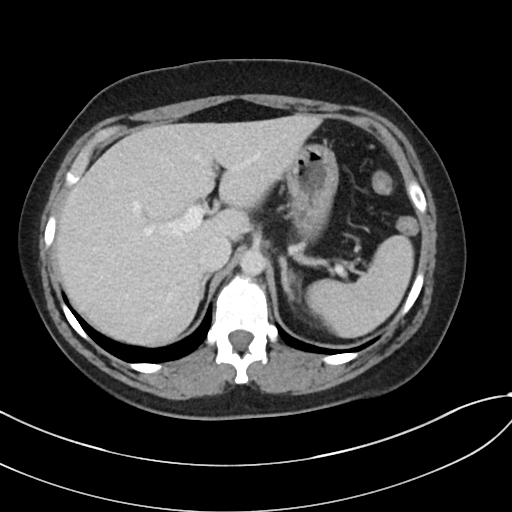
[im 83/95  soft-tissue]
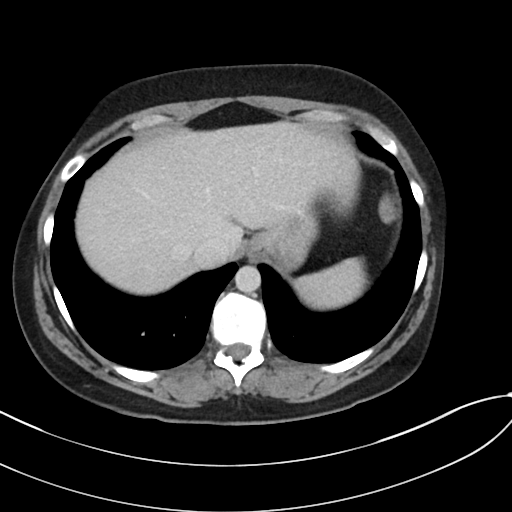
[im 91/95  soft-tissue]
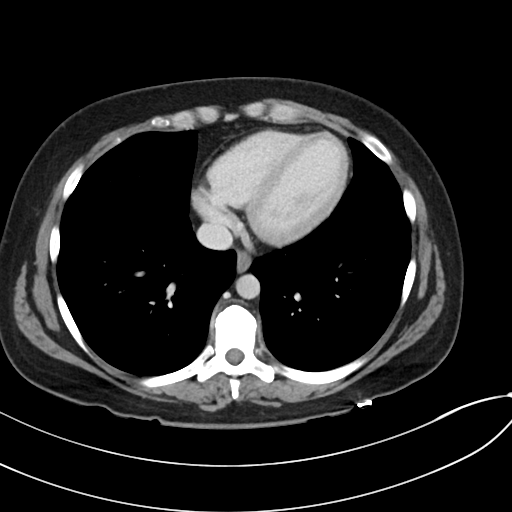

[Series 5: coronal · coronal · 0.82mm/px · 3 of 91 slices shown]
[im 31/91  soft-tissue]
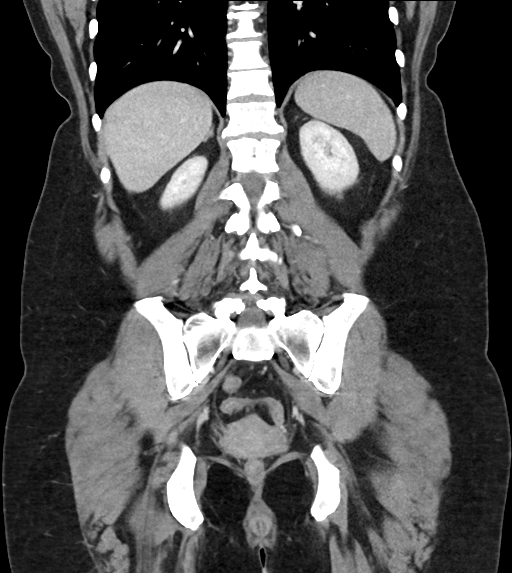
[im 41/91  soft-tissue]
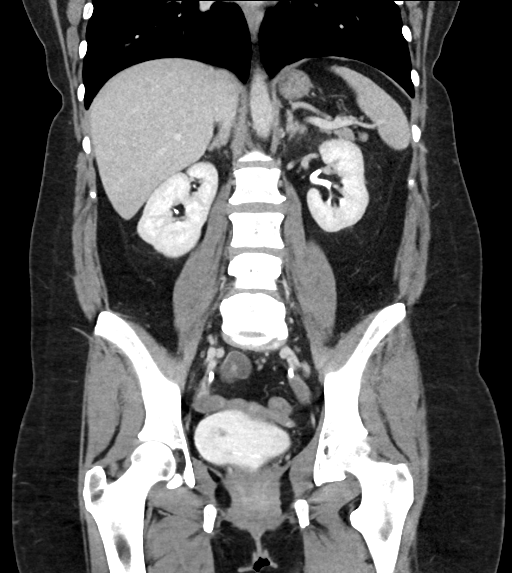
[im 51/91  soft-tissue]
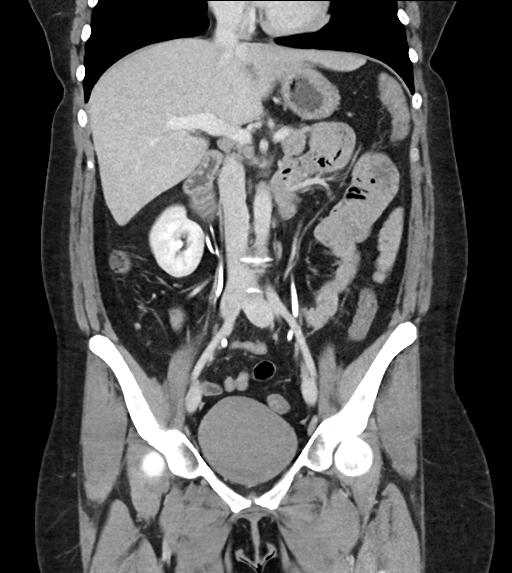

[17 of 46 positions shown; findings below may reference images not displayed]

FINDINGS: Lower chest: No acute abnormality.

Hepatobiliary: No focal liver abnormality is seen. No gallstones,
gallbladder wall thickening, or biliary dilatation.

Pancreas: Unremarkable. No pancreatic ductal dilatation or
surrounding inflammatory changes.

Spleen: Normal in size without focal abnormality.

Adrenals/Urinary Tract: Adrenal glands are unremarkable. Kidneys are
normal, without renal calculi, focal lesion, or hydronephrosis.
Bladder is unremarkable.

Stomach/Bowel: Stomach is within normal limits. Appendix appears
normal. No evidence of bowel wall thickening, distention, or
inflammatory changes.

Vascular/Lymphatic: No significant vascular findings are present. No
enlarged abdominal or pelvic lymph nodes.

Reproductive: Uterus and bilateral adnexa are unremarkable.

Other: No abdominal wall hernia or abnormality. No abdominopelvic
ascites.

Musculoskeletal: Bilateral L5-S1 pars articularis defects with 4 mm
anterolisthesis of L5 on S1.
IMPRESSION: 1. No evidence of acute abnormalities within the abdomen or pelvis.
2. Bilateral L5-S1 pars articularis defects with 4 mm
anterolisthesis of L5 on S1.

## 2024-04-30 ENCOUNTER — Ambulatory Visit: Admitting: Family Medicine

## 2024-04-30 ENCOUNTER — Encounter: Payer: Self-pay | Admitting: Family Medicine

## 2024-04-30 VITALS — BP 110/60 | HR 106 | Temp 98.7°F | Ht 61.0 in | Wt 185.6 lb

## 2024-04-30 DIAGNOSIS — J020 Streptococcal pharyngitis: Secondary | ICD-10-CM

## 2024-04-30 DIAGNOSIS — R059 Cough, unspecified: Secondary | ICD-10-CM

## 2024-04-30 DIAGNOSIS — R0600 Dyspnea, unspecified: Secondary | ICD-10-CM

## 2024-04-30 LAB — POCT RAPID STREP A (OFFICE): Rapid Strep A Screen: POSITIVE — AB

## 2024-04-30 LAB — POC COVID19 BINAXNOW: SARS Coronavirus 2 Ag: NEGATIVE

## 2024-04-30 MED ORDER — PSEUDOEPH-BROMPHEN-DM 30-2-10 MG/5ML PO SYRP: 5.0000 mL | ORAL_SOLUTION | Freq: Four times a day (QID) | ORAL | 0 refills | Status: AC | PRN

## 2024-04-30 MED ORDER — ALBUTEROL SULFATE HFA 108 (90 BASE) MCG/ACT IN AERS: 2.0000 | INHALATION_SPRAY | Freq: Four times a day (QID) | RESPIRATORY_TRACT | 0 refills | Status: AC | PRN

## 2024-04-30 MED ORDER — CEFDINIR 300 MG PO CAPS: 300.0000 mg | ORAL_CAPSULE | Freq: Two times a day (BID) | ORAL | 0 refills | Status: AC

## 2024-04-30 NOTE — Progress Notes (Signed)
 Acute Office Visit  Subjective:     Patient ID: Julie Daniel, female    DOB: [birth year], 43 y.o.   MRN: [id number]  Chief Complaint  Patient presents with   Sore Throat    X3 days   Nasal Congestion    X1 week   Shortness of Breath    X2 days   Cough    Productive with clear sputum x1 week    Sore Throat  Associated symptoms include coughing and shortness of breath.  Shortness of Breath  Cough Associated symptoms include shortness of breath.  History of Present Illness The patient presents with ear congestion and respiratory symptoms.  Symptoms have persisted for almost a week, including ear congestion and difficulty hearing. Peroxide was used without relief. Intermittent fevers and chills accompany significant congestion and a sensation of the tongue touching the back of the throat. There is a change in voice.  Shortness of breath and wheezing are present, similar to a previous episode of pneumonia. They have not used an inhaler before but feel it may be needed now. Coughing sometimes leads to vomiting large amounts of sputum, particularly when lying down at night, which also exacerbates wheezing.  Their medical history includes strep throat, sinus infections, and pneumonia. They have been treated with antibiotics such as Omnicef  and Augmentin  and are concerned about developing resistance to amoxicillin . Over-the-counter cold and flu medications have been used to manage symptoms, but they feel something stronger may be needed.   Review of Systems  Respiratory:  Positive for cough and shortness of breath.   All other systems reviewed and are negative.       Objective:    BP 110/60   Pulse (!) 106   Temp 98.7 F (37.1 C) (Oral)   Ht 5' 1 (1.549 m)   Wt 185 lb 9.6 oz (84.2 kg)   LMP 07/10/2015 (Approximate)   SpO2 95%   BMI 35.07 kg/m    Physical Exam Vitals reviewed.  Constitutional:      Appearance: She is well-developed. She is obese.  HENT:      Right Ear: Tympanic membrane normal.     Left Ear: Tympanic membrane normal.     Mouth/Throat:     Mouth: Mucous membranes are moist.     Pharynx: Oropharyngeal exudate and posterior oropharyngeal erythema present.  Cardiovascular:     Rate and Rhythm: Normal rate and regular rhythm.     Heart sounds: No murmur heard. Pulmonary:     Effort: Pulmonary effort is normal.     Breath sounds: Normal breath sounds. No wheezing.  Abdominal:     General: Bowel sounds are normal.  Lymphadenopathy:     Cervical: No cervical adenopathy.  Neurological:     Mental Status: She is alert and oriented to person, place, and time.     Results for orders placed or performed in visit on 04/30/24  POC COVID-19  Result Value Ref Range   SARS Coronavirus 2 Ag Negative Negative  POC Rapid Strep A  Result Value Ref Range   Rapid Strep A Screen Positive (A) Negative        Assessment & Plan:   Problem List Items Addressed This Visit   None Visit Diagnoses       Strep pharyngitis    -  Primary   Relevant Medications   cefdinir  (OMNICEF ) 300 MG capsule   brompheniramine-pseudoephedrine-DM 30-2-10 MG/5ML syrup   Other Relevant Orders   POC COVID-19 (Completed)  POC Rapid Strep A (Completed)     Cough, unspecified type       Relevant Medications   brompheniramine-pseudoephedrine-DM 30-2-10 MG/5ML syrup   albuterol  (PROAIR  HFA) 108 (90 Base) MCG/ACT inhaler   Other Relevant Orders   POC COVID-19 (Completed)     Dyspnea, unspecified type       Relevant Orders   POC COVID-19 (Completed)     Assessment and Plan Acute upper respiratory infection with streptococcal pharyngitis and acute sinusitis Symptoms have persisted for almost a week, including ear congestion, intermittent fever, chills, and significant throat redness. Positive strep test confirms streptococcal pharyngitis. Sinus involvement with congestion and mucus drainage causing cough. No wheezing or pneumonia signs on lung examination.  Previous treatments with Augmentin  and Omnicef , with Omnicef  being more effective. Decision to use Omnicef  again due to its efficacy and need for extended treatment due to sinus involvement. - Prescribed Omnicef  (cefdinir ) 300 mg twice daily for 10 days. - Prescribed a stronger decongestant syrup, 1-2 teaspoons every 6 hours for the first few days. - Refilled albuterol  inhaler for potential wheezing, though not currently severe. - Advised steam showers to help open sinuses. - Recommended chicken noodle soup or chicken broth for comfort. - Advised use of Tylenol  and ibuprofen as needed for symptom relief. - Provided work excuse until November 17th, 2025.  Meds ordered this encounter  Medications   cefdinir  (OMNICEF ) 300 MG capsule    Sig: Take 1 capsule (300 mg total) by mouth 2 (two) times daily for 10 days.    Dispense:  20 capsule    Refill:  0   brompheniramine-pseudoephedrine-DM 30-2-10 MG/5ML syrup    Sig: Take 5-10 mLs by mouth 4 (four) times daily as needed.    Dispense:  473 mL    Refill:  0   albuterol  (PROAIR  HFA) 108 (90 Base) MCG/ACT inhaler    Sig: Inhale 2 puffs into the lungs every 6 (six) hours as needed for wheezing or shortness of breath.    Dispense:  1 each    Refill:  0    No follow-ups on file.  Heron CHRISTELLA Sharper, MD
